# Patient Record
Sex: Male | Born: 1945 | Race: White | Hispanic: No | Marital: Married | Smoking: Former smoker
Health system: Southern US, Community
[De-identification: ages and names within clinical notes are randomized; demographics above are authoritative.]

## PROBLEM LIST (undated history)

## (undated) DIAGNOSIS — E119 Type 2 diabetes mellitus without complications: Secondary | ICD-10-CM

## (undated) DIAGNOSIS — G300 Alzheimer's disease with early onset: Secondary | ICD-10-CM

## (undated) DIAGNOSIS — F0280 Dementia in other diseases classified elsewhere without behavioral disturbance: Secondary | ICD-10-CM

## (undated) DIAGNOSIS — I2510 Atherosclerotic heart disease of native coronary artery without angina pectoris: Secondary | ICD-10-CM

## (undated) DIAGNOSIS — I10 Essential (primary) hypertension: Secondary | ICD-10-CM

## (undated) DIAGNOSIS — E785 Hyperlipidemia, unspecified: Secondary | ICD-10-CM

## (undated) DIAGNOSIS — F32A Depression, unspecified: Secondary | ICD-10-CM

## (undated) DIAGNOSIS — G450 Vertebro-basilar artery syndrome: Secondary | ICD-10-CM

## (undated) DIAGNOSIS — M1990 Unspecified osteoarthritis, unspecified site: Secondary | ICD-10-CM

## (undated) DIAGNOSIS — I272 Pulmonary hypertension, unspecified: Secondary | ICD-10-CM

## (undated) DIAGNOSIS — K219 Gastro-esophageal reflux disease without esophagitis: Secondary | ICD-10-CM

## (undated) DIAGNOSIS — F329 Major depressive disorder, single episode, unspecified: Secondary | ICD-10-CM

## (undated) HISTORY — DX: Major depressive disorder, single episode, unspecified: F32.9

## (undated) HISTORY — DX: Type 2 diabetes mellitus without complications: E11.9

## (undated) HISTORY — DX: Dementia in other diseases classified elsewhere without behavioral disturbance: F02.80

## (undated) HISTORY — DX: Unspecified osteoarthritis, unspecified site: M19.90

## (undated) HISTORY — DX: Gastro-esophageal reflux disease without esophagitis: K21.9

## (undated) HISTORY — DX: Hyperlipidemia, unspecified: E78.5

## (undated) HISTORY — DX: Atherosclerotic heart disease of native coronary artery without angina pectoris: I25.10

## (undated) HISTORY — DX: Vertebro-basilar artery syndrome: G45.0

## (undated) HISTORY — DX: Pulmonary hypertension, unspecified: I27.20

## (undated) HISTORY — DX: Alzheimer's disease with early onset: G30.0

## (undated) HISTORY — DX: Essential (primary) hypertension: I10

## (undated) HISTORY — DX: Depression, unspecified: F32.A

---
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
## 1996-08-30 HISTORY — PX: BACK SURGERY: SHX140

## 2000-08-30 HISTORY — PX: OTHER SURGICAL HISTORY: SHX169

## 2017-04-06 LAB — HEPATIC FUNCTION PANEL: Alkaline Phosphatase: 43 (ref 25–125)

## 2017-04-16 LAB — BASIC METABOLIC PANEL
BUN: 9 (ref 4–21)
CREATININE: 0.8 (ref <=1.3)
Glucose: 148
POTASSIUM: 4 (ref 3.4–5.3)
SODIUM: 138 (ref 137–147)

## 2017-04-16 LAB — CBC AND DIFFERENTIAL
HCT: 48 (ref 41–53)
HEMOGLOBIN: 16.4 (ref 13.5–17.5)
Platelets: 231 (ref 150–399)
WBC: 9

## 2017-04-16 LAB — HEPATIC FUNCTION PANEL
ALT: 24 (ref 10–40)
AST: 16 (ref 14–40)
Bilirubin, Total: 0.4

## 2017-04-16 LAB — LIPID PANEL
CHOLESTEROL: 183 (ref 0–200)
HDL: 49 (ref 35–70)
LDL Cholesterol: 83
TRIGLYCERIDES: 257 — AB (ref 40–160)

## 2017-07-29 ENCOUNTER — Encounter: Payer: Self-pay | Admitting: Family Medicine

## 2017-07-29 ENCOUNTER — Ambulatory Visit: Payer: PRIVATE HEALTH INSURANCE | Admitting: Family Medicine

## 2017-07-29 VITALS — BP 142/74 | HR 73 | Temp 97.4°F | Ht 68.5 in | Wt 295.8 lb

## 2017-07-29 DIAGNOSIS — F0390 Unspecified dementia without behavioral disturbance: Secondary | ICD-10-CM

## 2017-07-29 DIAGNOSIS — Z794 Long term (current) use of insulin: Secondary | ICD-10-CM

## 2017-07-29 DIAGNOSIS — Z7689 Persons encountering health services in other specified circumstances: Secondary | ICD-10-CM

## 2017-07-29 DIAGNOSIS — E1165 Type 2 diabetes mellitus with hyperglycemia: Secondary | ICD-10-CM

## 2017-07-29 MED ORDER — FREESTYLE LIBRE 14 DAY SENSOR MISC: 1.0000 [IU] | 11 refills | Status: DC

## 2017-07-29 NOTE — Progress Notes (Signed)
Subjective:    Patient ID: Cole LasterCharles Harris, male    DOB: 08/31/1945, 71 y.o.   MRN: [id number]  HPI This is a 71 yo male who presents today to establish care. He is accompanied by his wife and daughter in law. He has moved here recently [place]   Has dementia and was followed by neurology every 3 months.   He has history of diabetes. Has Libre and blood sugars are running 150-170. Brings labs with him today from last CPE 8/18, last hemoglobin A1C over 9.   Has history of back pain and multiple back surgeries, has implantable TENs unit, rarely takes pain medication.   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   Last CPE- 8/18 PSA-8/18 Colonoscopy- 2016 Tdap- unknown Flu-annual, already had Dental-regular Eye-regular Exercise-no   Past Medical History:  Diagnosis Date  . Arthritis   . Depression   . Diabetes mellitus without complication (HCC)   . GERD (gastroesophageal reflux disease)   . Hyperlipidemia   . Hypertension     Family History  Problem Relation Age of Onset  . Arthritis Mother   . Arthritis Father   . Hearing loss Father   . Heart disease Father   . Hyperlipidemia Father    Social History   Tobacco Use  . Smoking status: Former Games developermoker  . Smokeless tobacco: Never Used  Substance Use Topics  . Alcohol use: No    Frequency: Never  . Drug use: No      Review of Systems  Constitutional: Negative for fatigue, fever and unexpected weight change.  Respiratory: Negative for cough and shortness of breath.   Cardiovascular: Negative for chest pain, palpitations and leg swelling.  Gastrointestinal: Negative for abdominal pain, constipation and diarrhea.  Genitourinary: Negative for difficulty urinating and frequency.  Musculoskeletal: Positive for back pain (chronic, rarely takes pain meds).  Neurological: Negative for headaches.  Psychiatric/Behavioral: Negative for sleep disturbance.       Objective:   Physical  Exam  Constitutional: He appears well-developed and well-nourished. No distress.  HENT:  Head: Normocephalic and atraumatic.  Eyes: Conjunctivae are normal.  Cardiovascular: Normal rate, regular rhythm and normal heart sounds.  Pulmonary/Chest: Effort normal and breath sounds normal.  Musculoskeletal: He exhibits no edema.  Neurological: He is alert.  Skin: Skin is warm and dry. He is not diaphoretic.  Psychiatric: He has a normal mood and affect. His behavior is normal.  Vitals reviewed.     BP (!) 142/74 (BP Location: Right Arm, Patient Position: Sitting, Cuff Size: Normal)   Pulse 73   Temp (!) 97.4 F (36.3 C) (Oral)   Ht 5' 8.5" (1.74 m)   Wt 295 lb 12 oz (134.2 kg)   SpO2 96%   BMI 44.31 kg/m      Assessment & Plan:  1. Encounter to establish care - will request records - follow up in 3 months - will get him scheduled for Medicare AWV  2. Type 2 diabetes mellitus with hyperglycemia, with long-term current use of insulin (HCC) - will continue to use Libre monitor - last hemoglobin A1c was high, will check today - Continuous Blood Gluc Sensor (FREESTYLE LIBRE 14 DAY SENSOR) MISC; 1 Units by Does not apply route every 14 (fourteen) days.  Dispense: 1 each; Refill: 11 - Hemoglobin A1c  3. Dementia without behavioral disturbance, unspecified dementia type - continue current meds - Ambulatory referral to Neurology   Olean Cole Gessner, FNP-BC  [facility]  [place], [facility]  08/01/2017 5:53 PM

## 2017-07-29 NOTE — Patient Instructions (Signed)
Please stop at the lab  I will notify you of lab results  Please follow up in 3 months

## 2017-07-30 LAB — HEMOGLOBIN A1C
Hgb A1c MFr Bld: 9.4 % of total Hgb — ABNORMAL HIGH (ref <5.7)
Mean Plasma Glucose: 223 (calc)
eAG (mmol/L): 12.4 (calc)

## 2017-08-01 ENCOUNTER — Encounter: Payer: Self-pay | Admitting: Family Medicine

## 2017-08-04 ENCOUNTER — Encounter: Payer: Self-pay | Admitting: Family Medicine

## 2017-08-05 ENCOUNTER — Other Ambulatory Visit: Payer: Self-pay | Admitting: Family Medicine

## 2017-08-05 DIAGNOSIS — Z9989 Dependence on other enabling machines and devices: Principal | ICD-10-CM

## 2017-08-05 DIAGNOSIS — G4733 Obstructive sleep apnea (adult) (pediatric): Secondary | ICD-10-CM

## 2017-08-12 ENCOUNTER — Ambulatory Visit (INDEPENDENT_AMBULATORY_CARE_PROVIDER_SITE_OTHER): Payer: Medicare (Managed Care)

## 2017-08-12 ENCOUNTER — Other Ambulatory Visit: Payer: Self-pay | Admitting: Family Medicine

## 2017-08-12 VITALS — BP 126/70 | HR 69 | Temp 98.1°F | Ht 68.5 in | Wt 276.5 lb

## 2017-08-12 DIAGNOSIS — Z9189 Other specified personal risk factors, not elsewhere classified: Secondary | ICD-10-CM

## 2017-08-12 DIAGNOSIS — Z1159 Encounter for screening for other viral diseases: Secondary | ICD-10-CM

## 2017-08-12 DIAGNOSIS — Z0000 Encounter for general adult medical examination without abnormal findings: Secondary | ICD-10-CM | POA: Diagnosis not present

## 2017-08-12 MED ORDER — FREESTYLE LIBRE SENSOR SYSTEM MISC: 1.0000 [IU] | 5 refills | Status: DC | PRN

## 2017-08-12 NOTE — Patient Instructions (Signed)
[name] , Thank you for taking time to come for your Medicare Wellness Visit. I appreciate your ongoing commitment to your health goals. Please review the following plan we discussed and let me know if I can assist you in the future.   These are the goals we discussed: Goals    . DIET - INCREASE WATER INTAKE     Starting 08/12/2017, I will continue to drink at least 8-10 glasses of water daily.        This is a list of the screening recommended for you and due dates:  Health Maintenance  Topic Date Due  . Complete foot exam   11/27/2017*  . Tetanus Vaccine  08/29/2018*  . Pneumonia vaccines (1 of 2 - PCV13) 08/29/2018*  . Eye exam for diabetics  11/28/2017  . Hemoglobin A1C  01/26/2018  . Colon Cancer Screening  08/30/2024  . Flu Shot  Completed  .  Hepatitis C: One time screening is recommended by Center for Disease Control  (CDC) for  adults born from [birth year] through 1965.   Completed  *Topic was postponed. The date shown is not the original due date.   Preventive Care for Adults  A healthy lifestyle and preventive care can promote health and wellness. Preventive health guidelines for adults include the following key practices.  . A routine yearly physical is a good way to check with your health care provider about your health and preventive screening. It is a chance to share any concerns and updates on your health and to receive a thorough exam.  . Visit your dentist for a routine exam and preventive care every 6 months. Brush your teeth twice a day and floss once a day. Good oral hygiene prevents tooth decay and gum disease.  . The frequency of eye exams is based on your age, health, family medical history, use  of contact lenses, and other factors. Follow your health care provider's recommendations for frequency of eye exams.  . Eat a healthy diet. Foods like vegetables, fruits, whole grains, low-fat dairy products, and lean protein foods contain the nutrients you need without  too many calories. Decrease your intake of foods high in solid fats, added sugars, and salt. Eat the right amount of calories for you. Get information about a proper diet from your health care provider, if necessary.  . Regular physical exercise is one of the most important things you can do for your health. Most adults should get at least 150 minutes of moderate-intensity exercise (any activity that increases your heart rate and causes you to sweat) each week. In addition, most adults need muscle-strengthening exercises on 2 or more days a week.  Silver Sneakers may be a benefit available to you. To determine eligibility, you may visit the website: www.silversneakers.com or contact program at [phone] Mon-Fri between 8AM-8PM.   . Maintain a healthy weight. The body mass index (BMI) is a screening tool to identify possible weight problems. It provides an estimate of body fat based on height and weight. Your health care provider can find your BMI and can help you achieve or maintain a healthy weight.   For adults 20 years and older: ? A BMI below 18.5 is considered underweight. ? A BMI of 18.5 to 24.9 is normal. ? A BMI of 25 to 29.9 is considered overweight. ? A BMI of 30 and above is considered obese.   . Maintain normal blood lipids and cholesterol levels by exercising and minimizing your intake of saturated fat.  Eat a balanced diet with plenty of fruit and vegetables. Blood tests for lipids and cholesterol should begin at age 74 and be repeated every 5 years. If your lipid or cholesterol levels are high, you are over 50, or you are at high risk for heart disease, you may need your cholesterol levels checked more frequently. Ongoing high lipid and cholesterol levels should be treated with medicines if diet and exercise are not working.  . If you smoke, find out from your health care provider how to quit. If you do not use tobacco, please do not start.  . If you choose to drink alcohol,  please do not consume more than 2 drinks per day. One drink is considered to be 12 ounces (355 mL) of beer, 5 ounces (148 mL) of wine, or 1.5 ounces (44 mL) of liquor.  . If you are 72-51 years old, ask your health care provider if you should take aspirin to prevent strokes.  . Use sunscreen. Apply sunscreen liberally and repeatedly throughout the day. You should seek shade when your shadow is shorter than you. Protect yourself by wearing long sleeves, pants, a wide-brimmed hat, and sunglasses year round, whenever you are outdoors.  . Once a month, do a whole body skin exam, using a mirror to look at the skin on your back. Tell your health care provider of new moles, moles that have irregular borders, moles that are larger than a pencil eraser, or moles that have changed in shape or color.

## 2017-08-12 NOTE — Progress Notes (Signed)
PCP notes:   Health maintenance:  Foot exam - PCP please address at next appt Hep C screening - completed Tetanus vaccine - postponed/insurance PNA vaccine - awaiting patients medical records Eye exam - per pt report, exam in April 2018 Colonoscopy - per pt report, screening in 2016 Flu vaccine - per pt report, vaccine in August 2018  Abnormal screenings:   Depression score: 1 Mini-Cog score: 17/20 Hearing - failed  Hearing Screening   125Hz  250Hz  500Hz  1000Hz  2000Hz  3000Hz  4000Hz  6000Hz  8000Hz   Right ear:   40 40 0  0    Left ear:   40 40 40  0     Patient concerns:   Intermittent left knee pain - pt was under a pain management specialist in [place]. Patient unable to use cane for stability. PCP please address at next appt.   Blood glucose - per pt, blood glucose readings have been abnormal. States diet may be cause for elevated readings. Declined to participate in a diabetes education program at this time.   Nurse concerns:  None  Next PCP appt:   10/28/17 @ 1430

## 2017-08-12 NOTE — Progress Notes (Signed)
Pre visit review using our clinic review tool, if applicable. No additional management support is needed unless otherwise documented below in the visit note. 

## 2017-08-12 NOTE — Progress Notes (Signed)
Subjective:   Cole Harris is a 71 y.o. male who presents for an Initial Medicare Annual Wellness Visit.  Review of Systems  N/A Cardiac Risk Factors include: advanced age (>3755men, 52>65 women);diabetes mellitus;male gender;obesity (BMI >30kg/m2)    Objective:    Today's Vitals   08/12/17 1513  BP: 126/70  Pulse: 69  Temp: 98.1 F (36.7 C)  TempSrc: Oral  SpO2: 94%  Weight: 276 lb 8 oz (125.4 kg)  Height: 5' 8.5" (1.74 m)  PainSc: 0-No pain   Body mass index is 41.43 kg/m.  Advanced Directives 08/12/2017  Does Patient Have a Medical Advance Directive? No  Would patient like information on creating a medical advance directive? Yes (MAU/Ambulatory/Procedural Areas - Information given)    Current Medications (verified) Outpatient Encounter Medications as of 08/12/2017  Medication Sig  . atorvastatin (LIPITOR) 20 MG tablet Take 20 mg by mouth daily.  Marland Kitchen. buPROPion (WELLBUTRIN XL) 150 MG 24 hr tablet Take 150 mg by mouth daily.  . Cholecalciferol (VITAMIN D3) 50000 units TABS Take 50,000 Units by mouth once a week.  . Continuous Blood Gluc Sensor (FREESTYLE LIBRE SENSOR SYSTEM) MISC 1 Units by Does not apply route as needed.  Marland Kitchen. escitalopram (LEXAPRO) 20 MG tablet Take 20 mg by mouth daily.  . fenofibrate (TRICOR) 145 MG tablet Take 145 mg by mouth daily.  . furosemide (LASIX) 40 MG tablet Take 40 mg by mouth.  . insulin aspart (NOVOLOG) 100 UNIT/ML injection Inject 8 Units into the skin 3 (three) times daily before meals.  . insulin glargine (LANTUS) 100 UNIT/ML injection Inject 20 Units into the skin at bedtime.   . irbesartan (AVAPRO) 300 MG tablet Take 300 mg by mouth daily.  . Memantine HCl-Donepezil HCl (NAMZARIC) 28-10 MG CP24 Take 1 capsule by mouth daily.  . niacin (NIASPAN) 500 MG CR tablet Take 500 mg by mouth at bedtime.  Marland Kitchen. omeprazole (PRILOSEC) 20 MG capsule Take 20 mg by mouth daily.  . potassium chloride SA (K-DUR,KLOR-CON) 20 MEQ tablet Take 20 mEq by mouth  once.  . sitaGLIPtin-metformin (JANUMET) 50-1000 MG tablet Take 1 tablet by mouth 2 (two) times daily with a meal.  . [DISCONTINUED] Continuous Blood Gluc Sensor (FREESTYLE LIBRE 14 DAY SENSOR) MISC 1 Units by Does not apply route every 14 (fourteen) days.   No facility-administered encounter medications on file as of 08/12/2017.     Allergies (verified) Patient has no known allergies.   History: Past Medical History:  Diagnosis Date  . Arthritis   . Depression   . Diabetes mellitus without complication (HCC)   . GERD (gastroesophageal reflux disease)   . Hyperlipidemia   . Hypertension    Past Surgical History:  Procedure Laterality Date  . BACK SURGERY  1998  . INTERNAL TENS UNIT  2002  . TONSILLECTOMY  1955   Family History  Problem Relation Age of Onset  . Arthritis Mother   . Arthritis Father   . Hearing loss Father   . Heart disease Father   . Hyperlipidemia Father    Social History   Socioeconomic History  . Marital status: Married    Spouse name: None  . Number of children: None  . Years of education: None  . Highest education level: None  Social Needs  . Financial resource strain: None  . Food insecurity - worry: None  . Food insecurity - inability: None  . Transportation needs - medical: None  . Transportation needs - non-medical: None  Occupational History  .  None  Tobacco Use  . Smoking status: Former Games developermoker  . Smokeless tobacco: Never Used  Substance and Sexual Activity  . Alcohol use: No    Frequency: Never  . Drug use: No  . Sexual activity: No    Birth control/protection: Cervical cap  Other Topics Concern  . None  Social History Narrative  . None   Tobacco Counseling Counseling given: No   Clinical Intake:  Pre-visit preparation completed: Yes  Pain : No/denies pain Pain Score: 0-No pain     Nutritional Status: BMI > 30  Obese Nutritional Risks: None Diabetes: Yes CBG done?: No Did pt. bring in CBG monitor from home?:  No  How often do you need to have someone help you when you read instructions, pamphlets, or other written materials from your doctor or pharmacy?: 1 - Never What is the last grade level you completed in school?: 12th grade + vocational courses  Interpreter Needed?: No  Comments: pt and spouse live with son and daughter-in-law Information entered by :: LPinson, LPN  Activities of Daily Living In your present state of health, do you have any difficulty performing the following activities: 08/12/2017  Hearing? Y  Comment pt reports ringing in both ears  Vision? N  Difficulty concentrating or making decisions? Y  Walking or climbing stairs? Y  Comment SOB when climbing stairs  Dressing or bathing? N  Doing errands, shopping? N  Preparing Food and eating ? N  Using the Toilet? N  In the past six months, have you accidently leaked urine? N  Do you have problems with loss of bowel control? N  Managing your Medications? Y  Managing your Finances? Y  Housekeeping or managing your Housekeeping? N     Immunizations and Health Maintenance  There is no immunization history on file for this patient. There are no preventive care reminders to display for this patient.  Patient Care Team: Emi BelfastGessner, Deborah B, FNP as PCP - General (Nurse Practitioner)  Assessment:   This is a routine wellness examination for Cole Harris.  Hearing/Vision screen  Hearing Screening   125Hz  250Hz  500Hz  1000Hz  2000Hz  3000Hz  4000Hz  6000Hz  8000Hz   Right ear:   40 40 0  0    Left ear:   40 40 40  0    Vision Screening Comments: Last vision exam in 03/2017  Dietary issues and exercise activities discussed: Current Exercise Habits: The patient does not participate in regular exercise at present, Exercise limited by: orthopedic condition(s)  Goals    . DIET - INCREASE WATER INTAKE     Starting 08/12/2017, I will continue to drink at least 8-10 glasses of water daily.       Depression Screen PHQ 2/9 Scores  08/12/2017  PHQ - 2 Score 0  PHQ- 9 Score 1    Fall Risk Fall Risk  08/12/2017  Falls in the past year? No   Cognitive Function: MMSE - Mini Mental State Exam 08/12/2017  Orientation to time 5  Orientation to Place 5  Registration 3  Attention/ Calculation 0  Recall 0  Recall-comments unable to recall 3 of 3 words  Language- name 2 objects 0  Language- repeat 1  Language- follow 3 step command 3  Language- read & follow direction 0  Write a sentence 0  Copy design 0  Total score 17     PLEASE NOTE: A Mini-Cog screen was completed. Maximum score is 20. A value of 0 denotes this part of Folstein MMSE was not completed  or the patient failed this part of the Mini-Cog screening.   Mini-Cog Screening Orientation to Time - Max 5 pts Orientation to Place - Max 5 pts Registration - Max 3 pts Recall - Max 3 pts Language Repeat - Max 1 pts Language Follow 3 Step Command - Max 3 pts    Screening Tests Health Maintenance  Topic Date Due  . FOOT EXAM  11/27/2017 (Originally 06/02/1956)  . TETANUS/TDAP  08/29/2018 (Originally 06/02/1966)  . PNA vac Low Risk Adult (1 of 2 - PCV13) 08/29/2018 (Originally 06/02/2012)  . OPHTHALMOLOGY EXAM  11/28/2017  . HEMOGLOBIN A1C  01/26/2018  . COLONOSCOPY  08/30/2024  . INFLUENZA VACCINE  Completed  . Hepatitis C Screening  Completed       Plan:   I have personally reviewed, addressed, and noted the following in the patient's chart:  A. Medical and social history B. Use of alcohol, tobacco or illicit drugs  C. Current medications and supplements D. Functional ability and status E.  Nutritional status F.  Physical activity G. Advance directives H. List of other physicians I.  Hospitalizations, surgeries, and ER visits in previous 12 months J.  Vitals K. Screenings to include hearing, vision, cognitive, depression L. Referrals and appointments - none  In addition, I have reviewed and discussed with patient certain preventive protocols,  quality metrics, and best practice recommendations. A written personalized care plan for preventive services as well as general preventive health recommendations were provided to patient.  See attached scanned questionnaire for additional information.   Signed,   Randa Evens, MHA, BS, LPN Health Coach

## 2017-08-13 LAB — HEPATITIS C ANTIBODY
Hepatitis C Ab: NONREACTIVE
SIGNAL TO CUT-OFF: 0.02 (ref <1.00)

## 2017-08-15 ENCOUNTER — Encounter: Payer: Self-pay | Admitting: Internal Medicine

## 2017-08-15 ENCOUNTER — Other Ambulatory Visit: Payer: Self-pay | Admitting: Family Medicine

## 2017-08-15 DIAGNOSIS — R2681 Unsteadiness on feet: Secondary | ICD-10-CM

## 2017-08-15 MED ORDER — "ADJUSTABLE ALUMINUM CANE 3/4"" MISC": 1.0000 [IU] | 0 refills | Status: DC | PRN

## 2017-08-16 ENCOUNTER — Ambulatory Visit: Payer: Self-pay | Admitting: *Deleted

## 2017-08-16 ENCOUNTER — Encounter: Payer: Self-pay | Admitting: Internal Medicine

## 2017-08-16 ENCOUNTER — Ambulatory Visit (INDEPENDENT_AMBULATORY_CARE_PROVIDER_SITE_OTHER): Payer: PRIVATE HEALTH INSURANCE | Admitting: Internal Medicine

## 2017-08-16 VITALS — BP 140/78 | HR 83 | Resp 16 | Ht 68.5 in | Wt 293.0 lb

## 2017-08-16 DIAGNOSIS — R0609 Other forms of dyspnea: Secondary | ICD-10-CM

## 2017-08-16 DIAGNOSIS — G4733 Obstructive sleep apnea (adult) (pediatric): Secondary | ICD-10-CM

## 2017-08-16 DIAGNOSIS — E538 Deficiency of other specified B group vitamins: Secondary | ICD-10-CM | POA: Insufficient documentation

## 2017-08-16 NOTE — Patient Instructions (Addendum)
Continue using your Bipap every night.   Will check pulmonary function test, chest xray, overnight oxygen test on 2 liters and bipap.

## 2017-08-16 NOTE — Progress Notes (Signed)
[facility] [place] [facility] Consultation      Assessment and Plan:  Obstructive sleep apnea with sleep-related hypoxemia. -Patient's obstructive sleep apnea appears to be adequately treated at current BiPAP level of 18/9. - Continue BiPAP.  Dyspnea on exertion. -Multifactorial due to obesity, deconditioning/debility, as well as possible COPD. - We will check pulmonary function test to look for evidence of COPD, as well as baseline chest x-ray.  Orders Placed This Encounter  Procedures  . DG Chest 2 View  . Pulmonary Function Test [facility] Only  . Pulse oximetry, overnight    Return in about 6 months (around 02/14/2018).   Date: 08/16/2017  MRN# [id number] Jerimyah Vandunk [birth year]    Dearion Huot is a 71 y.o. old male seen in consultation for chief complaint of:    Chief Complaint  Patient presents with  . Advice Only    referred by Dr. Leone Payor for continuation of care OSA    HPI:   The patient is a 71 yo male, he is present with his wife and daughter who gives some of the history.  He has had a sleep study in the past and he has a CPAP, and is on it every night. He was started on it for more than 20 years, current machine is about 1 or 70 yrs old. They tried to do a sleep study but he apparently could not sleep. He feels that he is doing well with his CPAP. He thinks that the CPAP is helping but he still gets tired easily.  He goes to bed at MN or later, falls easily with cpap. Wife wakes him and he gets out of bed at 10:30 am. He feels that he is rested at those time but he can still sleep more.  He does not snore with the machine.  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   He feels that his breathing is doing well but he gets winded easily. He can do most ADL's. He smoked last 25 years ago, he smoked 1 to 2 ppd at that time, he stopped after his wife had a heart attack.    He has been told that he has COPD.   Review of download data 90 days as of 08/15/17; 88/90 days, avg 10 hrs per night. bipap settings of 18/9; residual AHI is 6.3.     PMHX:   Past Medical History:  Diagnosis Date  . Arthritis   . Depression   . Diabetes mellitus without complication (HCC)   . GERD (gastroesophageal reflux disease)   . Hyperlipidemia   . Hypertension    Surgical Hx:  Past Surgical History:  Procedure Laterality Date  . BACK SURGERY  1998  . INTERNAL TENS UNIT  2002  . TONSILLECTOMY  1955   Family Hx:  Family History  Problem Relation Age of Onset  . Arthritis Mother   . Arthritis Father   . Hearing loss Father   . Heart disease Father   . Hyperlipidemia Father    Social Hx:   Social History   Tobacco Use  . Smoking status: Former Games developer  . Smokeless tobacco: Never Used  Substance Use Topics  . Alcohol use: No    Frequency: Never  . Drug use: No   Medication:    Current Outpatient Medications:  .  atorvastatin (LIPITOR) 20 MG tablet, Take 20 mg by mouth daily., Disp: ,  Rfl:  .  buPROPion (WELLBUTRIN XL) 150 MG 24 hr tablet, Take 150 mg by mouth daily., Disp: , Rfl:  .  Cholecalciferol (VITAMIN D3) 50000 units TABS, Take 50,000 Units by mouth once a week., Disp: , Rfl:  .  Continuous Blood Gluc Sensor (FREESTYLE LIBRE SENSOR SYSTEM) MISC, 1 Units by Does not apply route as needed., Disp: 3 each, Rfl: 5 .  escitalopram (LEXAPRO) 20 MG tablet, Take 20 mg by mouth daily., Disp: , Rfl:  .  fenofibrate (TRICOR) 145 MG tablet, Take 145 mg by mouth daily., Disp: , Rfl:  .  furosemide (LASIX) 40 MG tablet, Take 40 mg by mouth., Disp: , Rfl:  .  insulin aspart (NOVOLOG) 100 UNIT/ML injection, Inject 8 Units into the skin 3 (three) times daily before meals., Disp: , Rfl:  .  insulin glargine (LANTUS) 100 UNIT/ML injection, Inject 20 Units into the skin at bedtime. , Disp: , Rfl:  .  irbesartan (AVAPRO) 300 MG tablet, Take 300 mg by mouth daily., Disp: ,  Rfl:  .  Memantine HCl-Donepezil HCl (NAMZARIC) 28-10 MG CP24, Take 1 capsule by mouth daily., Disp: , Rfl:  .  Misc. Devices (ADJUSTABLE ALUMINUM CANE 3/4") MISC, 1 Units by Does not apply route as needed., Disp: 1 each, Rfl: 0 .  niacin (NIASPAN) 500 MG CR tablet, Take 500 mg by mouth at bedtime., Disp: , Rfl:  .  omeprazole (PRILOSEC) 20 MG capsule, Take 20 mg by mouth daily., Disp: , Rfl:  .  potassium chloride SA (K-DUR,KLOR-CON) 20 MEQ tablet, Take 20 mEq by mouth once., Disp: , Rfl:  .  sitaGLIPtin-metformin (JANUMET) 50-1000 MG tablet, Take 1 tablet by mouth 2 (two) times daily with a meal., Disp: , Rfl:    Allergies:  Patient has no known allergies.  Review of Systems: Gen:  Denies  fever, sweats, chills HEENT: Denies blurred vision, double vision. bleeds, sore throat Cvc:  No dizziness, chest pain. Resp:   Denies cough or sputum production, shortness of breath Gi: Denies swallowing difficulty, stomach pain. Gu:  Denies bladder incontinence, burning urine Ext:   No Joint pain, stiffness. Skin: No skin rash,  hives  Endoc:  No polyuria, polydipsia. Psych: No depression, insomnia. Other:  All other systems were reviewed with the patient and were negative other that what is mentioned in the HPI.   Physical Examination:   VS: BP 140/78 (BP Location: Left Arm, Cuff Size: Normal)   Pulse 83   Resp 16   Ht 5' 8.5" (1.74 m)   Wt 293 lb (132.9 kg)   SpO2 95%   BMI 43.90 kg/m   General Appearance: No distress  Neuro:without focal findings,  speech normal,  HEENT: PERRLA, EOM intact.   Pulmonary: normal breath sounds, No wheezing.  CardiovascularNormal S1,S2.  No m/r/g.   Abdomen: Benign, Soft, non-tender. Renal:  No costovertebral tenderness  GU:  No performed at this time. Endoc: No evident thyromegaly, no signs of acromegaly. Skin:   warm, no rashes, no ecchymosis  Extremities: normal, no cyanosis, clubbing.  Other findings:    LABORATORY PANEL:   CBC No results  for input(s): WBC, HGB, HCT, PLT in the last 168 hours. ------------------------------------------------------------------------------------------------------------------  Chemistries  No results for input(s): NA, K, CL, CO2, GLUCOSE, BUN, CREATININE, CALCIUM, MG, AST, ALT, ALKPHOS, BILITOT in the last 168 hours.  Invalid input(s): GFRCGP ------------------------------------------------------------------------------------------------------------------  Cardiac Enzymes No results for input(s): TROPONINI in the last 168 hours. ------------------------------------------------------------  RADIOLOGY:  No results found.  Thank  you for the consultation and for allowing [facility] to assist in the care of your patient. Our recommendations are noted above.  Please contact us if we can be of further service.   Wells Guileseep Bethann Qualley, MD.  Board Certified in Internal Medicine, Pulmonary Medicine, Critical Care Medicine, and Sleep Medicine.  [facility] Office Number: [phone]  Santiago Gladavid Kasa, M.D.  Billy Fischeravid Simonds, M.D  08/16/2017

## 2017-08-16 NOTE — Telephone Encounter (Signed)
Pt's daughter in law Clydie BraunKaren calling that the pt has been experiencing dizziness through out the day that has worsened after getting home from appts with neurologist and pulmonologist. Pt states that the pt stumbled into the house and had is currently lying down in the bed. Pt is having complaints of seeing things "floating" while lying down with eyes closed. Pt also had one episode of emesis with current dizziness. Pt's current blood sugar was 148 and pt denies any pain at this time. Pt's daughter in law advised to take the pt to ED for further evaluation.    Reason for Disposition . SEVERE dizziness (e.g., unable to stand, requires support to walk, feels like passing out now)  Answer Assessment - Initial Assessment Questions 1. DESCRIPTION: "Describe your dizziness."     Seeing stuff floating around with eyes closed 2. LIGHTHEADED: "Do you feel lightheaded?" (e.g., somewhat faint, woozy, weak upon standing)     Feeling light headed with lying down 3. VERTIGO: "Do you feel like either you or the room is spinning or tilting?" (i.e. vertigo)     No 4. SEVERITY: "How bad is it?"  "Do you feel like you are going to faint?" "Can you stand and walk?"   - MILD - walking normally   - MODERATE - interferes with normal activities (e.g., work, school)    - SEVERE - unable to stand, requires support to walk, feels like passing out now.      severe 5. ONSET:  "When did the dizziness begin?"     today 6. AGGRAVATING FACTORS: "Does anything make it worse?" (e.g., standing, change in head position)     Position, pt states he can not 7. HEART RATE: "Can you tell me your heart rate?" "How many beats in 15 seconds?"  (Note: not all patients can do this)       19bpm per 15sec 8. CAUSE: "What do you think is causing the dizziness?"     unsure 9. RECURRENT SYMPTOM: "Have you had dizziness before?" If so, ask: "When was the last time?" "What happened that time?"     No 10. OTHER SYMPTOMS: "Do you have any other  symptoms?" (e.g., fever, chest pain, vomiting, diarrhea, bleeding) Vomiting  Protocols used: DIZZINESS Tereasa Coop- LIGHTHEADEDNESS-A-AH

## 2017-08-17 ENCOUNTER — Telehealth: Payer: Self-pay | Admitting: Internal Medicine

## 2017-08-17 ENCOUNTER — Telehealth: Payer: Self-pay | Admitting: Emergency Medicine

## 2017-08-17 NOTE — Telephone Encounter (Signed)
I spoke with Mrs Cole Harris and pt felt better last night and did not go to ED. Pt is still asleep this morning and Mrs  Cole Harris will cb if needed after pt wakes up. Pt has CPX scheduled 08/18/17. FYI to Harlin Heys Gessner FNP.

## 2017-08-17 NOTE — Telephone Encounter (Signed)
Barbara CowerJason from [facility] called to tell Dr. Ardyth Manam that they cannot preform the ONO for patient because they are not in current service with [facility] Patients O2 provider should be able to handle this  Jason's phone is [phone] ext. [phone]

## 2017-08-17 NOTE — Telephone Encounter (Signed)
Called and left detailed message informing patient that prescription has for cane is here at the office. Wanted to know if he wanted to come pick it up or have us mail for fax it. Also Debbie wanted to know how his blood sugars have been since the change in medication. Please ask patient upon his return call to office.

## 2017-08-17 NOTE — Telephone Encounter (Signed)
[facility] and he states that he can't get the ONO done due to pt has O2 at this time from [place] company [place].  Called pt and wife and informed them we can't do an ONO due to [place] and that they need to contact that company to see if they are going to continue to pay for the O2.   DR informed until pt takes care [place] we can't do an ONO.

## 2017-08-17 NOTE — Telephone Encounter (Signed)
Please see message below

## 2017-08-18 ENCOUNTER — Emergency Department
Admission: EM | Admit: 2017-08-18 | Discharge: 2017-08-18 | Disposition: A | Discharge status: Home / Self Care | Payer: Medicare Other | Attending: Emergency Medicine | Admitting: Emergency Medicine

## 2017-08-18 ENCOUNTER — Other Ambulatory Visit: Payer: Self-pay

## 2017-08-18 ENCOUNTER — Telehealth: Payer: Self-pay | Admitting: Family Medicine

## 2017-08-18 ENCOUNTER — Ambulatory Visit: Payer: Self-pay | Admitting: *Deleted

## 2017-08-18 ENCOUNTER — Emergency Department: Payer: Medicare Other

## 2017-08-18 DIAGNOSIS — E119 Type 2 diabetes mellitus without complications: Secondary | ICD-10-CM | POA: Diagnosis not present

## 2017-08-18 DIAGNOSIS — Z87891 Personal history of nicotine dependence: Secondary | ICD-10-CM | POA: Insufficient documentation

## 2017-08-18 DIAGNOSIS — R42 Dizziness and giddiness: Secondary | ICD-10-CM | POA: Diagnosis not present

## 2017-08-18 DIAGNOSIS — I10 Essential (primary) hypertension: Secondary | ICD-10-CM | POA: Insufficient documentation

## 2017-08-18 DIAGNOSIS — R110 Nausea: Secondary | ICD-10-CM | POA: Insufficient documentation

## 2017-08-18 LAB — URINALYSIS, COMPLETE (UACMP) WITH MICROSCOPIC
BILIRUBIN URINE: NEGATIVE
Bacteria, UA: NONE SEEN
GLUCOSE, UA: NEGATIVE mg/dL
Hgb urine dipstick: NEGATIVE
KETONES UR: NEGATIVE mg/dL
LEUKOCYTES UA: NEGATIVE
Nitrite: NEGATIVE
PH: 5 (ref 5.0–8.0)
Protein, ur: NEGATIVE mg/dL
SPECIFIC GRAVITY, URINE: 1.018 (ref 1.005–1.030)

## 2017-08-18 LAB — COMPREHENSIVE METABOLIC PANEL
ALK PHOS: 37 U/L — AB (ref 38–126)
ALT: 20 U/L (ref 17–63)
AST: 28 U/L (ref 15–41)
Albumin: 4.4 g/dL (ref 3.5–5.0)
Anion gap: 9 (ref 5–15)
BILIRUBIN TOTAL: 0.8 mg/dL (ref 0.3–1.2)
BUN: 10 mg/dL (ref 6–20)
CALCIUM: 10.1 mg/dL (ref 8.9–10.3)
CO2: 23 mmol/L (ref 22–32)
CREATININE: 0.72 mg/dL (ref 0.61–1.24)
Chloride: 104 mmol/L (ref 101–111)
Glucose, Bld: 161 mg/dL — ABNORMAL HIGH (ref 65–99)
Potassium: 4 mmol/L (ref 3.5–5.1)
Sodium: 136 mmol/L (ref 135–145)
TOTAL PROTEIN: 7.7 g/dL (ref 6.5–8.1)

## 2017-08-18 LAB — CBC
HCT: 48.3 % (ref 40.0–52.0)
Hemoglobin: 16.5 g/dL (ref 13.0–18.0)
MCH: 32.6 pg (ref 26.0–34.0)
MCHC: 34.2 g/dL (ref 32.0–36.0)
MCV: 95.2 fL (ref 80.0–100.0)
PLATELETS: 215 10*3/uL (ref 150–440)
RBC: 5.07 MIL/uL (ref 4.40–5.90)
RDW: 12.8 % (ref 11.5–14.5)
WBC: 8 10*3/uL (ref 3.8–10.6)

## 2017-08-18 LAB — LIPASE, BLOOD: LIPASE: 26 U/L (ref 11–51)

## 2017-08-18 LAB — TROPONIN I: Troponin I: <0.03 ng/mL (ref <0.03)

## 2017-08-18 MED ORDER — MECLIZINE HCL 25 MG PO TABS
25.0000 mg | ORAL_TABLET | Freq: Once | ORAL | Status: AC
  Administered 2017-08-18: 25 mg via ORAL
  Filled 2017-08-18: qty 1

## 2017-08-18 MED ORDER — MECLIZINE HCL 25 MG PO TABS: 25.0000 mg | ORAL_TABLET | Freq: Three times a day (TID) | ORAL | 1 refills | Status: DC | PRN

## 2017-08-18 NOTE — ED Provider Notes (Signed)
[facility] Emergency Department Provider Note       Time seen: ----------------------------------------- 4:44 PM on 08/18/2017 -----------------------------------------   I have reviewed the triage vital signs and the nursing notes.  HISTORY   Chief Complaint Dizziness and Nausea    HPI Cole Harris is a 71 y.o. male with a history of depression, diabetes, hypertension and hyperlipidemia who presents to the ED for dizziness with nausea and vomiting for several days.  Patient states his last emesis was last night.  He does have a slight headache.  Family obtained a blood sugar of 198 because they thought his blood sugar was making him dizzy.  Dizziness seems to be worse with standing.  Family has also described fatigue.  Past Medical History:  Diagnosis Date  . Arthritis   . Depression   . Diabetes mellitus without complication (HCC)   . GERD (gastroesophageal reflux disease)   . Hyperlipidemia   . Hypertension     There are no active problems to display for this patient.   Past Surgical History:  Procedure Laterality Date  . BACK SURGERY  1998  . INTERNAL TENS UNIT  2002  . TONSILLECTOMY  1955    Allergies Patient has no known allergies.  Social History Social History   Tobacco Use  . Smoking status: Former Games developermoker  . Smokeless tobacco: Never Used  Substance Use Topics  . Alcohol use: No    Frequency: Never  . Drug use: No    Review of Systems Constitutional: Negative for fever. Eyes: Negative for vision changes ENT:  Negative for congestion, sore throat Cardiovascular: Negative for chest pain. Respiratory: Negative for shortness of breath. Gastrointestinal: Negative for abdominal pain, vomiting and diarrhea. Genitourinary: Negative for dysuria. Musculoskeletal: Negative for back pain. Skin: Negative for rash. Neurological: Negative for headaches, focal weakness or numbness.  Positive for dizziness  All systems  negative/normal/unremarkable except as stated in the HPI  ____________________________________________   PHYSICAL EXAM:  VITAL SIGNS: ED Triage Vitals  Enc Vitals Group     BP 08/18/17 1515 140/71     Pulse Rate 08/18/17 1515 79     Resp 08/18/17 1515 16     Temp 08/18/17 1515 (!) 97.4 F (36.3 C)     Temp Source 08/18/17 1515 Oral     SpO2 08/18/17 1515 93 %     Weight 08/18/17 1516 290 lb (131.5 kg)     Height 08/18/17 1516 5\' 9"  (1.753 m)     Head Circumference --      Peak Flow --      Pain Score 08/18/17 1515 5     Pain Loc --      Pain Edu? --      Excl. in GC? --     Constitutional: Alert and oriented. Well appearing and in no distress. Eyes: Conjunctivae are normal. Normal extraocular movements. ENT   Head: Normocephalic and atraumatic.   Nose: No congestion/rhinnorhea.   Mouth/Throat: Mucous membranes are moist.   Neck: No stridor. Cardiovascular: Normal rate, regular rhythm. No murmurs, rubs, or gallops. Respiratory: Normal respiratory effort without tachypnea nor retractions. Breath sounds are clear and equal bilaterally. No wheezes/rales/rhonchi. Gastrointestinal: Soft and nontender. Normal bowel sounds Musculoskeletal: Nontender with normal range of motion in extremities. No lower extremity tenderness nor edema. Neurologic:  Normal speech and language. No gross focal neurologic deficits are appreciated.  Negative Romberg sign Skin:  Skin is warm, dry and intact. No rash noted. Psychiatric: Mood and affect are normal.  Speech and behavior are normal.  ____________________________________________  EKG: Interpreted by me.  Sinus rhythm rate of 77 bpm, normal PR interval, normal QRS, normal QT.  Left axis deviation  ____________________________________________  ED COURSE:  As part of my medical decision making, I reviewed the following data within the electronic MEDICAL RECORD NUMBER History obtained from family if available, nursing notes, old chart  and ekg, as well as notes from prior ED visits. Patient presented for dizziness and fatigue, we will assess with labs and imaging as indicated at this time.   Procedures ____________________________________________   LABS (pertinent positives/negatives)  Labs Reviewed  COMPREHENSIVE METABOLIC PANEL - Abnormal; Notable for the following components:      Result Value   Glucose, Bld 161 (*)    Alkaline Phosphatase 37 (*)    All other components within normal limits  URINALYSIS, COMPLETE (UACMP) WITH MICROSCOPIC - Abnormal; Notable for the following components:   Color, Urine YELLOW (*)    APPearance CLEAR (*)    Squamous Epithelial / LPF 0-5 (*)    All other components within normal limits  LIPASE, BLOOD  CBC    RADIOLOGY Images were viewed by me  CT head Is unremarkable ____________________________________________  DIFFERENTIAL DIAGNOSIS   Peripheral vertigo, dehydration, electrolyte abnormality, medication side effect, CVA  FINAL ASSESSMENT AND PLAN  Dizziness, weakness   Plan: Patient had presented for dizziness and weakness. Patient's labs were within normal limits. Patient's imaging did not reveal any acute process.  He does describe some symptoms of peripheral vertigo.  I think he is had a viral illness with recent diarrhea which is causing vertigo.  He has received meclizine and states he currently feels better.  He is stable for outpatient follow-up.   Emily FilbertWilliams, Ameya Vowell E, MD   Note: This note was generated in part or whole with voice recognition software. Voice recognition is usually quite accurate but there are transcription errors that can and very often do occur. I apologize for any typographical errors that were not detected and corrected.     Emily FilbertWilliams, Johnnette Laux E, MD 08/18/17 Mikle [id number]

## 2017-08-18 NOTE — Telephone Encounter (Signed)
See previous triage note. Daughter in law called back reporting worsening symptoms. Advised to go to ED.

## 2017-08-18 NOTE — ED Notes (Signed)
Pt denies n/v and dizziness at this time.

## 2017-08-18 NOTE — Telephone Encounter (Signed)
Carlean PurlMaryann Barbour RN also noted; Daughter in law reports pt "getting worse." Taking pt to ED.

## 2017-08-18 NOTE — Telephone Encounter (Signed)
Pts daughter- in- law called to report pt with dizziness, nausea and vomiting and increased downiness. Spoke directly to pt. who denies any of those symptoms today, "I think I had a virus. I feel fine today."  Relayed this conversation to daughter- in- law who reports pt recently dx with moderate dementia and states information provided by pt is inaccurate. She states pt refuses to go to ED. Spoke with Environmental consultantlow Coordinator Waynetta. Appt made for 08/19/17 with Dr. Milinda Antisower. Advised to go to ED if symptoms worsen. Verbalizes understanding.

## 2017-08-18 NOTE — Telephone Encounter (Signed)
Called and spoke with patients wife, she states that they will be by the office today to pick prescription up. I informed her that I placed it upfront and it will be ready when she gets here. She also states that blood sugars have been better averaging between 115-120. I informed her that I would let Debbie know. Nothing further needed at this time.

## 2017-08-18 NOTE — ED Triage Notes (Signed)
Pt arrives to ED with c/o of dizziness and N&V x few days. Pt states last vomit was last night. States slight HA. Family took CBG and was 190 when they thought his sugar was making him dizzy. Pt appears tired. Alert and oriented. HOH.

## 2017-08-18 NOTE — ED Notes (Signed)
Pt was offered ODT zofran but states he doesn't want that at this time.

## 2017-08-18 NOTE — Telephone Encounter (Signed)
Opened in error

## 2017-08-19 ENCOUNTER — Ambulatory Visit: Payer: PRIVATE HEALTH INSURANCE | Admitting: Family Medicine

## 2017-08-24 ENCOUNTER — Ambulatory Visit: Payer: Self-pay

## 2017-08-24 ENCOUNTER — Encounter: Payer: Self-pay | Admitting: Family Medicine

## 2017-08-24 DIAGNOSIS — I272 Pulmonary hypertension, unspecified: Secondary | ICD-10-CM | POA: Insufficient documentation

## 2017-08-24 DIAGNOSIS — E039 Hypothyroidism, unspecified: Secondary | ICD-10-CM | POA: Insufficient documentation

## 2017-08-24 DIAGNOSIS — E1169 Type 2 diabetes mellitus with other specified complication: Secondary | ICD-10-CM | POA: Insufficient documentation

## 2017-08-24 DIAGNOSIS — I2510 Atherosclerotic heart disease of native coronary artery without angina pectoris: Secondary | ICD-10-CM

## 2017-08-24 DIAGNOSIS — E785 Hyperlipidemia, unspecified: Secondary | ICD-10-CM | POA: Insufficient documentation

## 2017-08-24 DIAGNOSIS — G300 Alzheimer's disease with early onset: Secondary | ICD-10-CM

## 2017-08-24 DIAGNOSIS — F0280 Dementia in other diseases classified elsewhere without behavioral disturbance: Secondary | ICD-10-CM

## 2017-08-24 DIAGNOSIS — G450 Vertebro-basilar artery syndrome: Secondary | ICD-10-CM | POA: Insufficient documentation

## 2017-08-24 HISTORY — DX: Vertebro-basilar artery syndrome: G45.0

## 2017-08-24 HISTORY — DX: Pulmonary hypertension, unspecified: I27.20

## 2017-08-24 HISTORY — DX: Atherosclerotic heart disease of native coronary artery without angina pectoris: I25.10

## 2017-08-24 HISTORY — DX: Dementia in other diseases classified elsewhere, unspecified severity, without behavioral disturbance, psychotic disturbance, mood disturbance, and anxiety: F02.80

## 2017-08-24 NOTE — Telephone Encounter (Signed)
   Reason for Disposition . [1] MODERATE dizziness (e.g., vertigo; feels very unsteady, interferes with normal activities) AND [2] has been evaluated by physician for this  Answer Assessment - Initial Assessment Questions 1. DESCRIPTION: "Describe your dizziness."     Still dizzy - taking Meclizine 2. VERTIGO: "Do you feel like either you or the room is spinning or tilting?"      Yes 3. LIGHTHEADED: "Do you feel lightheaded?" (e.g., somewhat faint, woozy, weak upon standing)     Feels woozy 4. SEVERITY: "How bad is it?"  "Can you walk?"   - MILD - Feels unsteady but walking normally.   - MODERATE - Feels very unsteady when walking, but not falling; interferes with normal activities (e.g., school, work) .   - SEVERE - Unable to walk without falling (requires assistance).     Moderate 5. ONSET:  "When did the dizziness begin?"     Started last Tuesday 6. AGGRAVATING FACTORS: "Does anything make it worse?" (e.g., standing, change in head position)     Changing position. 7. CAUSE: "What do you think is causing the dizziness?"     Vertigo 8. RECURRENT SYMPTOM: "Have you had dizziness before?" If so, ask: "When was the last time?" "What happened that time?"     Yes- taking Meclizine 9. OTHER SYMPTOMS: "Do you have any other symptoms?" (e.g., headache, weakness, numbness, vomiting, earache)     No 10. PREGNANCY: "Is there any chance you are pregnant?" "When was your last menstrual period?"       No  Protocols used: DIZZINESS - VERTIGO-A-AH  Pt. Seen in ED 08/18/17 - Diagnosed with vertigo and dehydration. Family states he is still having episodes of dizziness. Meclizine does help, but makes him sleepy. Appointment made for this week.

## 2017-08-26 ENCOUNTER — Encounter: Payer: Self-pay | Admitting: Primary Care

## 2017-08-26 ENCOUNTER — Ambulatory Visit (INDEPENDENT_AMBULATORY_CARE_PROVIDER_SITE_OTHER): Payer: Medicare (Managed Care) | Admitting: Primary Care

## 2017-08-26 VITALS — BP 118/68 | HR 70 | Temp 97.7°F | Wt 278.0 lb

## 2017-08-26 DIAGNOSIS — R42 Dizziness and giddiness: Secondary | ICD-10-CM

## 2017-08-26 NOTE — Patient Instructions (Signed)
Stop by the front desk and speak with either Shirlee LimerickMarion or Zella Ballobin regarding your referral to ENT.   Use your cane when walking.  Rise slowly after resting.  Take a look at the information below.  Please go to the hospital if you develop one sided weakness/numbness, changes in speech, increased dizziness.  It was a pleasure meeting you!  How to Perform the Epley Maneuver The Epley maneuver is an exercise that relieves symptoms of vertigo. Vertigo is the feeling that you or your surroundings are moving when they are not. When you feel vertigo, you may feel like the room is spinning and have trouble walking. Dizziness is a little different than vertigo. When you are dizzy, you may feel unsteady or light-headed. You can do this maneuver at home whenever you have symptoms of vertigo. You can do it up to 3 times a day until your symptoms go away. Even though the Epley maneuver may relieve your vertigo for a few weeks, it is possible that your symptoms will return. This maneuver relieves vertigo, but it does not relieve dizziness. What are the risks? If it is done correctly, the Epley maneuver is considered safe. Sometimes it can lead to dizziness or nausea that goes away after a short time. If you develop other symptoms, such as changes in vision, weakness, or numbness, stop doing the maneuver and call your health care provider. How to perform the Epley maneuver 1. Sit on the edge of a bed or table with your back straight and your legs extended or hanging over the edge of the bed or table. 2. Turn your head halfway toward the affected ear or side. 3. Lie backward quickly with your head turned until you are lying flat on your back. You may want to position a pillow under your shoulders. 4. Hold this position for 30 seconds. You may experience an attack of vertigo. This is normal. 5. Turn your head to the opposite direction until your unaffected ear is facing the floor. 6. Hold this position for 30  seconds. You may experience an attack of vertigo. This is normal. Hold this position until the vertigo stops. 7. Turn your whole body to the same side as your head. Hold for another 30 seconds. 8. Sit back up. You can repeat this exercise up to 3 times a day. Follow these instructions at home:  After doing the Epley maneuver, you can return to your normal activities.  Ask your health care provider if there is anything you should do at home to prevent vertigo. He or she may recommend that you: ? Keep your head raised (elevated) with two or more pillows while you sleep. ? Do not sleep on the side of your affected ear. ? Get up slowly from bed. ? Avoid sudden movements during the day. ? Avoid extreme head movement, like looking up or bending over. Contact a health care provider if:  Your vertigo gets worse.  You have other symptoms, including: ? Nausea. ? Vomiting. ? Headache. Get help right away if:  You have vision changes.  You have a severe or worsening headache or neck pain.  You cannot stop vomiting.  You have new numbness or weakness in any part of your body. Summary  Vertigo is the feeling that you or your surroundings are moving when they are not.  The Epley maneuver is an exercise that relieves symptoms of vertigo.  If the Epley maneuver is done correctly, it is considered safe. You can do it up  to 3 times a day. This information is not intended to replace advice given to you by your health care provider. Make sure you discuss any questions you have with your health care provider. Document Released: 08/21/2013 Document Revised: 07/06/2016 Document Reviewed: 07/06/2016 Elsevier Interactive Patient Education  2017 [employer]

## 2017-08-26 NOTE — Progress Notes (Signed)
Subjective:    Patient ID: Cole LasterCharles Harris, male    DOB: 08/04/1946, 71 y.o.   MRN: [id number]  HPI  [name] is a 71 year old male who presents today with a chief complaint of dizziness.  He presented to [facility] ED on 08/18/17 with a chief complaint of dizziness and nausea with vomiting. He also endorsed a headache, mild hyperglycemia. Dizziness was worse with standing.   During his ED stay he underwent lab testing including lipase, CMP, CBC, UA, and troponin which were grossly normal. He also underwent ECG, orthostatic vitals, and CT head which were unremarkable. His symptoms were thought to be secondary to peripheral vertigo as he responded well to Meclizine during his stay. He was discharged home later that day.  His dizziness improved for a short period of time but returned shortly after his visit. His son is with him today who states that he's noticed his father being off balance when turning too quickly. He's been taking Meclizine twice daily since his ED visit but hasn't noticed much improvement. He also reports ringing in the ears. He denies vomiting, falls, unilateral weakness, numbness/tinlging, speech changes.  He's checking his blood sugars every morning before meals and is getting readings of 130's-140's. He's compliant to his Lanuts 20 units at bedtime and Novolog sliding scale TID for sugars above 150.   Review of Systems  Constitutional: Negative for fever.  Respiratory: Negative for shortness of breath.   Cardiovascular: Negative for chest pain.  Gastrointestinal: Negative for diarrhea, nausea and vomiting.  Neurological: Positive for dizziness. Negative for weakness, numbness and headaches.       Past Medical History:  Diagnosis Date  . Alzheimer's disease with early onset 08/24/2017  . Arthritis   . CAD (coronary artery disease) 08/24/2017  . Depression   . Diabetes mellitus without complication (HCC)   . GERD (gastroesophageal reflux disease)   . Hyperlipidemia     . Hypertension   . Pulmonary hypertension (HCC) 08/24/2017  . Vertebrobasilar insufficiency 08/24/2017     Social History   Socioeconomic History  . Marital status: Married    Spouse name: Not on file  . Number of children: Not on file  . Years of education: Not on file  . Highest education level: Not on file  Social Needs  . Financial resource strain: Not on file  . Food insecurity - worry: Not on file  . Food insecurity - inability: Not on file  . Transportation needs - medical: Not on file  . Transportation needs - non-medical: Not on file  Occupational History  . Not on file  Tobacco Use  . Smoking status: Former Games developermoker  . Smokeless tobacco: Never Used  Substance and Sexual Activity  . Alcohol use: No    Frequency: Never  . Drug use: No  . Sexual activity: No    Birth control/protection: Cervical cap  Other Topics Concern  . Not on file  Social History Narrative  . Not on file    Past Surgical History:  Procedure Laterality Date  . BACK SURGERY  1998  . INTERNAL TENS UNIT  2002  . TONSILLECTOMY  1955    Family History  Problem Relation Age of Onset  . Arthritis Mother   . Arthritis Father   . Hearing loss Father   . Heart disease Father   . Hyperlipidemia Father     No Known Allergies  Current Outpatient Medications on File Prior to Visit  Medication Sig Dispense Refill  .  atorvastatin (LIPITOR) 20 MG tablet Take 20 mg by mouth daily.    Cole Harris. buPROPion (WELLBUTRIN XL) 150 MG 24 hr tablet Take 150 mg by mouth daily.    . Cholecalciferol (VITAMIN D3) 50000 units TABS Take 50,000 Units by mouth once a week.    . Continuous Blood Gluc Sensor (FREESTYLE LIBRE SENSOR SYSTEM) MISC 1 Units by Does not apply route as needed. 3 each 5  . escitalopram (LEXAPRO) 20 MG tablet Take 20 mg by mouth daily.    . fenofibrate (TRICOR) 145 MG tablet Take 145 mg by mouth daily.    . furosemide (LASIX) 40 MG tablet Take 40 mg by mouth.    . insulin aspart (NOVOLOG) 100  UNIT/ML injection Inject 8 Units into the skin 3 (three) times daily before meals.    . insulin glargine (LANTUS) 100 UNIT/ML injection Inject 20 Units into the skin at bedtime.     . irbesartan (AVAPRO) 300 MG tablet Take 300 mg by mouth daily.    . meclizine (ANTIVERT) 25 MG tablet Take 1 tablet (25 mg total) by mouth 3 (three) times daily as needed for dizziness or nausea. 30 tablet 1  . Memantine HCl-Donepezil HCl (NAMZARIC) 28-10 MG CP24 Take 1 capsule by mouth daily.    . Misc. Devices (ADJUSTABLE ALUMINUM CANE 3/4") MISC 1 Units by Does not apply route as needed. 1 each 0  . niacin (NIASPAN) 500 MG CR tablet Take 500 mg by mouth at bedtime.    Cole Harris. omeprazole (PRILOSEC) 20 MG capsule Take 20 mg by mouth daily.    . potassium chloride SA (K-DUR,KLOR-CON) 20 MEQ tablet Take 20 mEq by mouth once.    . sitaGLIPtin-metformin (JANUMET) 50-1000 MG tablet Take 1 tablet by mouth 2 (two) times daily with a meal.     No current facility-administered medications on file prior to visit.     BP 118/68 (BP Location: Left Arm, Patient Position: Sitting, Cuff Size: Large)   Pulse 70   Temp 97.7 F (36.5 C) (Oral)   Wt 278 lb (126.1 kg)   SpO2 95%   BMI 41.05 kg/m    Objective:   Physical Exam  Constitutional: He is oriented to person, place, and time. He appears well-nourished.  HENT:  Right Ear: Tympanic membrane and ear canal normal.  Left Ear: Tympanic membrane and ear canal normal.  Eyes: EOM are normal. Pupils are equal, round, and reactive to light.  Neck: Neck supple.  Cardiovascular: Normal rate and regular rhythm.  Pulmonary/Chest: Effort normal and breath sounds normal.  Neurological: He is alert and oriented to person, place, and time. No cranial nerve deficit.  No facial drooping, arm drift, slurred speech.  Skin: Skin is warm and dry.          Assessment & Plan:  Vertigo:  Evaluated in the ED on 08/18/17, full work up negative for CVA, infection, anemia, significant  hyperglycemia, acute coronary syndrome. Exam today overall unremarkable and suggests vertigo. Discussed to rise slowly when standing, use cane for stability to prevent falls. Referral placed to ENT for further evaluation and treatment. Handout provided regarding Epley's Manuevers. Strict return precautions provided.  All hospital labs, imaging, notes reviewed.  Morrie Sheldonlark,Jaeden Messer Kendal, NP

## 2017-09-02 NOTE — Progress Notes (Signed)
I reviewed health advisor's note, was available for consultation, and agree with documentation and plan.  

## 2017-09-21 ENCOUNTER — Other Ambulatory Visit: Payer: Self-pay | Admitting: Family Medicine

## 2017-09-21 MED ORDER — FREESTYLE LIBRE 14 DAY SENSOR MISC: 1.0000 [IU] | 5 refills | Status: DC

## 2017-09-28 ENCOUNTER — Telehealth: Payer: Self-pay | Admitting: Family Medicine

## 2017-09-28 MED ORDER — FREESTYLE LIBRE 14 DAY READER DEVI: 1.0000 | 0 refills | Status: DC

## 2017-09-28 NOTE — Telephone Encounter (Signed)
Copied from CRM [phone]. Topic: Quick Communication - See Telephone Encounter >> Sep 28, 2017  9:18 AM Clack, Princella PellegriniJessica D wrote: CRM for notification. See Telephone encounter for:  Steele SizerDolly from [facility] states they need the freestyle libre reader.  09/28/17.

## 2017-09-28 NOTE — Telephone Encounter (Signed)
Called and spoke with [facility] pharmacy. Pharmacist states that they received a Rx for 2 of the Freestyle 14 day Sensors but did not get the Reader Device Rx. This has been sent in. Nothing further needed.

## 2017-10-18 ENCOUNTER — Telehealth: Payer: Self-pay | Admitting: Internal Medicine

## 2017-10-18 NOTE — Telephone Encounter (Signed)
Patient has not been contacted about getting o2 for the home   He qualified for needing it per wife but had to return supply to [employer] and is now without here in [place]   Please call to discuss

## 2017-10-18 NOTE — Telephone Encounter (Signed)
Returned call to patient and made aware per insurance guidelines he will need another office visit before and ONO will have to be re-ordered within 30 days of visit.  Pt states he is doing will with out 02 right now so he will call us back when he decides to proceed. Nothing further needed.ss

## 2017-10-28 ENCOUNTER — Ambulatory Visit (INDEPENDENT_AMBULATORY_CARE_PROVIDER_SITE_OTHER): Payer: PRIVATE HEALTH INSURANCE | Admitting: Family Medicine

## 2017-10-28 ENCOUNTER — Encounter (INDEPENDENT_AMBULATORY_CARE_PROVIDER_SITE_OTHER): Payer: Self-pay

## 2017-10-28 ENCOUNTER — Encounter: Payer: Self-pay | Admitting: Family Medicine

## 2017-10-28 VITALS — BP 124/62 | HR 87 | Temp 98.3°F | Wt 290.2 lb

## 2017-10-28 DIAGNOSIS — E1165 Type 2 diabetes mellitus with hyperglycemia: Secondary | ICD-10-CM

## 2017-10-28 DIAGNOSIS — G4733 Obstructive sleep apnea (adult) (pediatric): Secondary | ICD-10-CM | POA: Diagnosis not present

## 2017-10-28 DIAGNOSIS — Z794 Long term (current) use of insulin: Secondary | ICD-10-CM

## 2017-10-28 MED ORDER — INSULIN GLARGINE 100 UNIT/ML ~~LOC~~ SOLN: 20.0000 [IU] | Freq: Every day | SUBCUTANEOUS | 5 refills | Status: DC

## 2017-10-28 NOTE — Progress Notes (Signed)
   Subjective:    Patient ID: Cole Harris, male    DOB: 02/27/1946, 72 y.o.   MRN: [id number]  HPI This is a 72 yo male, accompanied by his wife and daughter in law.    Diabetes- Checking blood sugars at home and running 80- 120s. Readings have improved since increase in lantus dose to 20 units nightly.   OSA with BiPAP. Had to return his oxygen concentrator to [employer]. Needs it with his BiPap. Sees Dr. Nicholos Harris (pulmonary).  Occasional cough, no SOB or wheeze. No chest pain. No abdominal pain, nausea/vomiting/diarrhea or constipation.   Right knee pain comes and goes. No recent falls.     Past Medical History:  Diagnosis Date  . Alzheimer's disease with early onset 08/24/2017  . Arthritis   . CAD (coronary artery disease) 08/24/2017  . Depression   . Diabetes mellitus without complication (HCC)   . GERD (gastroesophageal reflux disease)   . Hyperlipidemia   . Hypertension   . Pulmonary hypertension (HCC) 08/24/2017  . Vertebrobasilar insufficiency 08/24/2017   Past Surgical History:  Procedure Laterality Date  . BACK SURGERY  1998  . INTERNAL TENS UNIT  2002  . TONSILLECTOMY  1955   Family History  Problem Relation Age of Onset  . Arthritis Mother   . Arthritis Father   . Hearing loss Father   . Heart disease Father   . Hyperlipidemia Father    Social History   Tobacco Use  . Smoking status: Former Games developermoker  . Smokeless tobacco: Never Used  Substance Use Topics  . Alcohol use: No    Frequency: Never  . Drug use: No      Review of Systems Per HPI    Objective:   Physical Exam  Constitutional: He appears well-developed and well-nourished. No distress.  Obese.   HENT:  Head: Normocephalic and atraumatic.  Eyes: Conjunctivae are normal.  Neck: Normal range of motion. Neck supple.  Cardiovascular: Normal rate, regular rhythm and normal heart sounds.  Pulmonary/Chest: Effort normal and breath sounds normal.  Neurological: He is alert.  Interactive,  answers questions appropriately.   Skin: Skin is warm and dry. He is not diaphoretic.  Psychiatric: He has a normal mood and affect. His behavior is normal. Judgment and thought content normal.  Vitals reviewed.    BP 124/62   Pulse 87   Temp 98.3 F (36.8 C) (Oral)   Wt 290 lb 4 oz (131.7 kg)   SpO2 94%   BMI 42.86 kg/m  Wt Readings from Last 3 Encounters:  10/28/17 290 lb 4 oz (131.7 kg)  08/26/17 278 lb (126.1 kg)  08/18/17 290 lb (131.5 kg)        Assessment & Plan:  1. Type 2 diabetes mellitus with hyperglycemia, with long-term current use of insulin (HCC) - insulin glargine (LANTUS) 100 UNIT/ML injection; Inject 0.2 mLs (20 Units total) into the skin at bedtime.  Dispense: 10 mL; Refill: 5 - Hemoglobin A1c  2. OSA treated with BiPAP - [name]'s daughter in law will contact Dr. Carolyn Harris's office regarding getting the appropriate equipment. She will let me know if she has any difficulties.   - follow up in 6 months Olean Reeeborah Gessner, FNP-BC  [facility], [facility]  10/31/2017 1:02 PM

## 2017-10-28 NOTE — Patient Instructions (Signed)
Please send Dr. Carolyn Stareamachandran's office a mychart message about your BIPAP oxygen.

## 2017-10-29 LAB — HEMOGLOBIN A1C
EAG (MMOL/L): 9.2 (calc)
Hgb A1c MFr Bld: 7.4 % of total Hgb — ABNORMAL HIGH (ref <5.7)
Mean Plasma Glucose: 166 (calc)

## 2017-11-01 ENCOUNTER — Ambulatory Visit (INDEPENDENT_AMBULATORY_CARE_PROVIDER_SITE_OTHER): Payer: Medicare (Managed Care) | Admitting: Internal Medicine

## 2017-11-01 ENCOUNTER — Encounter: Payer: Self-pay | Admitting: Internal Medicine

## 2017-11-01 VITALS — BP 130/82 | HR 85 | Resp 16 | Ht 69.0 in | Wt 290.0 lb

## 2017-11-01 DIAGNOSIS — J449 Chronic obstructive pulmonary disease, unspecified: Secondary | ICD-10-CM | POA: Diagnosis not present

## 2017-11-01 DIAGNOSIS — G4733 Obstructive sleep apnea (adult) (pediatric): Secondary | ICD-10-CM

## 2017-11-01 NOTE — Patient Instructions (Signed)
Will need chest xray and pulmonary function test, ordered previously.

## 2017-11-01 NOTE — Progress Notes (Signed)
[facility] [place] [facility] Consultation      Assessment and Plan:  Obstructive sleep apnea with sleep-related hypoxemia. - Continue BiPAP at 18/9. --AHI increased to 26 today with recurrence of daytime sleepiness, will check overnight oximetry on CPAP and room air, and add oxygen as indicated.  He will follow-up in approximately 6 weeks time at that time we will see if his AHI and daytime sleepiness have improved, if not we may need to perform a BiPAP titration test  Dyspnea on exertion. -Multifactorial due to obesity, deconditioning/debility, as well as possible COPD. - We will check pulmonary function test to look for evidence of COPD, as well as baseline chest x-ray.  Orders Placed This Encounter  Procedures  . Pulse oximetry, overnight    Return in about 6 weeks (around 12/13/2017).   Date: 11/01/2017  MRN# [id number] Cole Harris 1946/05/25    Cole Harris is a 72 y.o. old male seen in consultation for chief complaint of:    Chief Complaint  Patient presents with  . Sleep Apnea    Pt has been taking mask off since he has been off 02. He feels like he is not getting enough air.  Marland Kitchen COPD    Pt had to turn 02 in because it was from [place]. Pt needs to get 02 in [place].    HPI:   The patient is a 72 yo male, he is present with his wife and daughter who gives some of the history.  He has had a sleep study in the past and he has a CPAP, and is on it every night. He was started on it for more than 20 years, current machine is about 1 or 71 yrs old.  He was previously on oxygen with CPAP, however after recent move here from [place] he had to surrender his oxygen to his home care company there before we could start oxygen here.  In addition his insurance required the oxygen order within 30 days of an office visit, therefore he is back here today so that we can order an overnight oximetry. He has been having trouble tolerating the pressure because he feels he is not  getting enough air.  Last visit we ordered a chest x-ray, overnight oximetry, pulmonary function test, these tests have not yet been performed.  He notes that he is more sleepy today than he has been in the last few months, he has been more sleepy recently.  He thinks that the CPAP is helping but he still gets tired easily. 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    He feels that his breathing is doing well but he gets winded easily. He can do most ADL's. He smoked last 25 years ago, he smoked 1 to 2 ppd at that time, he stopped after his wife had a heart attack.  He has been told that he has COPD.   Review of download data in 90 days as of 10/31/17.  Use greater than 4 hours is 89/90 days.  Average usage on days used is 10 hours 50 minutes.  Setting is 18/9.  Residual AHI is 26.4, which are obstructive. Review of download data 90 days as of 08/15/17; 88/90 days, avg 10 hrs per night. bipap settings of 18/9; residual AHI is 6.3.   Medication:    Current Outpatient Medications:  .  atorvastatin (LIPITOR) 20  MG tablet, Take 20 mg by mouth daily., Disp: , Rfl:  .  buPROPion (WELLBUTRIN XL) 150 MG 24 hr tablet, Take 150 mg by mouth daily., Disp: , Rfl:  .  Cholecalciferol (VITAMIN D3) 50000 units TABS, Take 50,000 Units by mouth once a week., Disp: , Rfl:  .  Continuous Blood Gluc Receiver (FREESTYLE LIBRE 14 DAY READER) DEVI, 1 each by Does not apply route as directed. Check blood sugar daily as directed., Disp: 1 Device, Rfl: 0 .  Continuous Blood Gluc Sensor (FREESTYLE LIBRE 14 DAY SENSOR) MISC, 1 Units by Does not apply route every 14 (fourteen) days., Disp: 2 each, Rfl: 5 .  escitalopram (LEXAPRO) 20 MG tablet, Take 20 mg by mouth daily., Disp: , Rfl:  .  fenofibrate (TRICOR) 145 MG tablet, Take 145 mg by mouth daily., Disp: , Rfl:  .  furosemide (LASIX) 40 MG tablet, Take 40 mg by mouth., Disp:  , Rfl:  .  insulin aspart (NOVOLOG) 100 UNIT/ML injection, Inject 8 Units into the skin 3 (three) times daily before meals., Disp: , Rfl:  .  insulin glargine (LANTUS) 100 UNIT/ML injection, Inject 0.2 mLs (20 Units total) into the skin at bedtime., Disp: 10 mL, Rfl: 5 .  irbesartan (AVAPRO) 300 MG tablet, Take 300 mg by mouth daily., Disp: , Rfl:  .  meclizine (ANTIVERT) 25 MG tablet, Take 1 tablet (25 mg total) by mouth 3 (three) times daily as needed for dizziness or nausea., Disp: 30 tablet, Rfl: 1 .  Memantine HCl-Donepezil HCl (NAMZARIC) 28-10 MG CP24, Take 1 capsule by mouth daily., Disp: , Rfl:  .  Misc. Devices (ADJUSTABLE ALUMINUM CANE 3/4") MISC, 1 Units by Does not apply route as needed., Disp: 1 each, Rfl: 0 .  niacin (NIASPAN) 500 MG CR tablet, Take 500 mg by mouth at bedtime., Disp: , Rfl:  .  omeprazole (PRILOSEC) 20 MG capsule, Take 20 mg by mouth daily., Disp: , Rfl:  .  potassium chloride SA (K-DUR,KLOR-CON) 20 MEQ tablet, Take 20 mEq by mouth once., Disp: , Rfl:  .  sitaGLIPtin-metformin (JANUMET) 50-1000 MG tablet, Take 1 tablet by mouth 2 (two) times daily with a meal., Disp: , Rfl:    Allergies:  Patient has no known allergies.  Review of Systems: Gen:  Denies  fever, sweats, chills HEENT: Denies blurred vision, double vision. bleeds, sore throat Cvc:  No dizziness, chest pain. Resp:   Denies cough or sputum production, shortness of breath Gi: Denies swallowing difficulty, stomach pain. Gu:  Denies bladder incontinence, burning urine Ext:   No Joint pain, stiffness. Skin: No skin rash,  hives  Endoc:  No polyuria, polydipsia. Psych: No depression, insomnia. Other:  All other systems were reviewed with the patient and were negative other that what is mentioned in the HPI.   Physical Examination:   VS: BP 130/82 (BP Location: Left Arm, Cuff Size: Large)   Pulse 85   Resp 16   Ht 5\' 9"  (1.753 m)   Wt 290 lb (131.5 kg)   SpO2 97%   BMI 42.83 kg/m   General  Appearance: No distress  Neuro:without focal findings,  speech normal,  HEENT: PERRLA, EOM intact.   Pulmonary: normal breath sounds, No wheezing.  CardiovascularNormal S1,S2.  No m/r/g.   Abdomen: Benign, Soft, non-tender. Renal:  No costovertebral tenderness  GU:  No performed at this time. Endoc: No evident thyromegaly, no signs of acromegaly. Skin:   warm, no rashes, no ecchymosis  Extremities: normal, no  cyanosis, clubbing.  Other findings:    LABORATORY PANEL:   CBC No results for input(s): WBC, HGB, HCT, PLT in the last 168 hours. ------------------------------------------------------------------------------------------------------------------  Chemistries  No results for input(s): NA, K, CL, CO2, GLUCOSE, BUN, CREATININE, CALCIUM, MG, AST, ALT, ALKPHOS, BILITOT in the last 168 hours.  Invalid input(s): GFRCGP ------------------------------------------------------------------------------------------------------------------  Cardiac Enzymes No results for input(s): TROPONINI in the last 168 hours. ------------------------------------------------------------  RADIOLOGY:  No results found.     Thank  you for the consultation and for allowing [facility] to assist in the care of your patient. Our recommendations are noted above.  Please contact us if we can be of further service.   Wells Guiles, MD.  Board Certified in Internal Medicine, Pulmonary Medicine, Critical Care Medicine, and Sleep Medicine.  [facility] Office Number: [phone]  Santiago Glad, M.D.  Billy Fischer, M.D  11/01/2017

## 2017-11-04 ENCOUNTER — Telehealth: Payer: Self-pay | Admitting: Internal Medicine

## 2017-11-04 NOTE — Telephone Encounter (Signed)
Patient still waiting on hh to call and set up appt for ono   Please call to discuss delay

## 2017-11-04 NOTE — Telephone Encounter (Signed)
Called patient and pt's wife stated that [facility]PS dropped off pulse ox this morning. Nothing else needed at this time. Rhonda J Cobb

## 2017-11-14 ENCOUNTER — Encounter (INDEPENDENT_AMBULATORY_CARE_PROVIDER_SITE_OTHER): Payer: Self-pay

## 2017-11-15 ENCOUNTER — Encounter: Payer: Self-pay | Admitting: Internal Medicine

## 2017-11-17 ENCOUNTER — Telehealth: Payer: Self-pay | Admitting: *Deleted

## 2017-11-17 ENCOUNTER — Telehealth: Payer: Self-pay | Admitting: Family Medicine

## 2017-11-17 NOTE — Telephone Encounter (Unsigned)
Copied from CRM [phone]#[id number]. Topic: Quick Communication - See Telephone Encounter >> Nov 17, 2017  3:07 PM Waymon AmatoBurton, Donna F wrote: CRM for notification. See Telephone encounter for: 11/17/17.pt daughter in law is calling to speak with someone regarding pt and him sleeping all the time and the results from the pulmonary appt  She is on pt DPR  Best number -Isidor Holtskaren Choquette [phone]

## 2017-11-17 NOTE — Telephone Encounter (Signed)
I spoke with Clydie BraunKaren (DPR signed) and pt is tired all the time and sleeping a lot during the day. [place] Pt saw pulmonologist here. The pulse ox never got below 88%. Pt had 26 episodes of sleep apnea with C pap. Clydie BraunKaren wanted D Leone PayorGessner FNP be aware of what is going on. Clydie BraunKaren is waiting to hear from pulmonologist. Clydie BraunKaren will call back if needed.FYI to Harlin Heys Gessner FNP.

## 2017-11-17 NOTE — Telephone Encounter (Signed)
Noted. I see they have a call in to pulmonary as well.

## 2017-11-17 NOTE — Telephone Encounter (Signed)
Spoke with patient's daughter in-law and gave normal  ONO results. She states something is going on because there has been an increase in apnea episode. Patient is continually have excessive daytime fatigue. Please advise.

## 2017-11-21 ENCOUNTER — Telehealth: Payer: Self-pay | Admitting: *Deleted

## 2017-11-21 NOTE — Telephone Encounter (Signed)
Left voicemail for Cole BraunKaren daughter-in-law that results of ONO are normal. We will see Mr. [name] as scheduled in 2 weeks for further evaluation.

## 2017-11-21 NOTE — Telephone Encounter (Signed)
appt scheduled 12/05/17

## 2017-11-21 NOTE — Telephone Encounter (Signed)
Follow up in 2 weeks with download, will decide on further management at that time.

## 2017-12-02 NOTE — Progress Notes (Signed)
[facility]      Assessment and Plan:  Obstructive sleep apnea with sleep-related hypoxemia. - Continue BiPAP at 18/9. --Continue symptoms of daytime sleepiness, has trouble tolerating current settings, and often takes the mask off in the middle the night without replacing it.  On previous download AHI was noted to be increased to 26. -Was unable to obtain a download from his card today, patient will need referral to a DME company to repair his machine.   Dyspnea on exertion. -Multifactorial due to obesity, deconditioning/debility, as well as possible COPD.  Previously ordered bony function test and baseline chest x-ray.   Orders Placed This Encounter  Procedures  . Bipap titration  . PSG SLEEP STUDY    Return in about 6 weeks (around 01/16/2018).   Date: 12/02/2017  MRN# [id number] Cole ChaletCharles V Harris 05/11/1946    Cole Harris is a 72 y.o. old male seen in consultation for chief complaint of:    Chief Complaint  Patient presents with  . Sleep Apnea    pt here c/o daytime sleepiness family report since d/c of 02 he pulls mask for cpap offf.    HPI:   The patient is a 72 yo male, he is present with his wife and daughter who gives some of the history.  He has a long-standing history of obstructive sleep apnea, has been on CPAP for more than 20 years.  He was last seen in order to his reestablish oxygen use with his CPAP.  An overnight oximetry was ordered on CPAP, this did not show overnight hypoxemia therefore oxygen was discontinued. He is still getting used to the CPAP, he continues to have some daytime sleepiness, and having trouble tolerating the pressure.  He feels tired much of the time and waking up frequently, often taking often taking off the mask, he feels that he is getting smothered and then takes the mask off. He has had current machine for about a year, they moved here from [place] about 6 months ago.   Review of download data in 90  days as of 10/31/17.  Use greater than 4 hours is 89/90 days.  Average usage on days used is 10 hours 50 minutes.  Setting is 18/9.  Residual AHI is 26.4, which are obstructive. Review of download data 90 days as of 08/15/17; 88/90 days, avg 10 hrs per night. bipap settings of 18/9; residual AHI is 6.3.   Medication:    Current Outpatient Medications:  .  atorvastatin (LIPITOR) 20 MG tablet, Take 20 mg by mouth daily., Disp: , Rfl:  .  buPROPion (WELLBUTRIN XL) 150 MG 24 hr tablet, Take 150 mg by mouth daily., Disp: , Rfl:  .  Cholecalciferol (VITAMIN D3) 50000 units TABS, Take 50,000 Units by mouth once a week., Disp: , Rfl:  .  Continuous Blood Gluc Receiver (FREESTYLE LIBRE 14 DAY READER) DEVI, 1 each by Does not apply route as directed. Check blood sugar daily as directed., Disp: 1 Device, Rfl: 0 .  Continuous Blood Gluc Sensor (FREESTYLE LIBRE 14 DAY SENSOR) MISC, 1 Units by Does not apply route every 14 (fourteen) days., Disp: 2 each, Rfl: 5 .  escitalopram (LEXAPRO) 20 MG tablet, Take 20 mg by mouth daily., Disp: , Rfl:  .  fenofibrate (TRICOR) 145 MG tablet, Take 145 mg by mouth daily., Disp: , Rfl:  .  furosemide (LASIX) 40 MG tablet, Take 40 mg by mouth., Disp: , Rfl:  .  insulin aspart (NOVOLOG) 100  UNIT/ML injection, Inject 8 Units into the skin 3 (three) times daily before meals., Disp: , Rfl:  .  insulin glargine (LANTUS) 100 UNIT/ML injection, Inject 0.2 mLs (20 Units total) into the skin at bedtime., Disp: 10 mL, Rfl: 5 .  irbesartan (AVAPRO) 300 MG tablet, Take 300 mg by mouth daily., Disp: , Rfl:  .  meclizine (ANTIVERT) 25 MG tablet, Take 1 tablet (25 mg total) by mouth 3 (three) times daily as needed for dizziness or nausea., Disp: 30 tablet, Rfl: 1 .  Memantine HCl-Donepezil HCl (NAMZARIC) 28-10 MG CP24, Take 1 capsule by mouth daily., Disp: , Rfl:  .  Misc. Devices (ADJUSTABLE ALUMINUM CANE 3/4") MISC, 1 Units by Does not apply route as needed., Disp: 1 each, Rfl: 0 .  niacin  (NIASPAN) 500 MG CR tablet, Take 500 mg by mouth at bedtime., Disp: , Rfl:  .  omeprazole (PRILOSEC) 20 MG capsule, Take 20 mg by mouth daily., Disp: , Rfl:  .  potassium chloride SA (K-DUR,KLOR-CON) 20 MEQ tablet, Take 20 mEq by mouth once., Disp: , Rfl:  .  sitaGLIPtin-metformin (JANUMET) 50-1000 MG tablet, Take 1 tablet by mouth 2 (two) times daily with a meal., Disp: , Rfl:    Allergies:  Patient has no known allergies.  Review of Systems: Gen:  Denies  fever, sweats, chills HEENT: Denies blurred vision, double vision. bleeds, sore throat Cvc:  No dizziness, chest pain. Resp:   Denies cough or sputum production, shortness of breath Gi: Denies swallowing difficulty, stomach pain. Gu:  Denies bladder incontinence, burning urine Ext:   No Joint pain, stiffness. Skin: No skin rash,  hives  Endoc:  No polyuria, polydipsia. Psych: No depression, insomnia. Other:  All other systems were reviewed with the patient and were negative other that what is mentioned in the HPI.   Physical Examination:   VS: BP 122/70 (BP Location: Left Arm, Cuff Size: Large)   Pulse (!) 109   Resp 16   Ht 5\' 9"  (1.753 m)   Wt 291 lb (132 kg)   SpO2 95%   BMI 42.97 kg/m   General Appearance: No distress  Neuro:without focal findings,  speech normal,  HEENT: PERRLA, EOM intact.   Pulmonary: normal breath sounds, No wheezing.  CardiovascularNormal S1,S2.  No m/r/g.   Abdomen: Benign, Soft, non-tender. Renal:  No costovertebral tenderness  GU:  No performed at this time. Endoc: No evident thyromegaly, no signs of acromegaly. Skin:   warm, no rashes, no ecchymosis  Extremities: normal, no cyanosis, clubbing.  Other findings:    LABORATORY PANEL:   CBC No results for input(s): WBC, HGB, HCT, PLT in the last 168 hours. ------------------------------------------------------------------------------------------------------------------  Chemistries  No results for input(s): NA, K, CL, CO2, GLUCOSE,  BUN, CREATININE, CALCIUM, MG, AST, ALT, ALKPHOS, BILITOT in the last 168 hours.  Invalid input(s): GFRCGP ------------------------------------------------------------------------------------------------------------------  Cardiac Enzymes No results for input(s): TROPONINI in the last 168 hours. ------------------------------------------------------------  RADIOLOGY:  No results found.     Thank  you for the consultation and for allowing [facility] to assist in the care of your patient. Our recommendations are noted above.  Please contact us if we can be of further service.   Wells Guiles, MD.  Board Certified in Internal Medicine, Pulmonary Medicine, Critical Care Medicine, and Sleep Medicine.  [facility] Office Number: [phone]  Santiago Glad, M.D.  Billy Fischer, M.D  12/02/2017

## 2017-12-05 ENCOUNTER — Encounter: Payer: Self-pay | Admitting: Internal Medicine

## 2017-12-05 ENCOUNTER — Ambulatory Visit (INDEPENDENT_AMBULATORY_CARE_PROVIDER_SITE_OTHER): Payer: Medicare (Managed Care) | Admitting: Internal Medicine

## 2017-12-05 VITALS — BP 122/70 | HR 109 | Resp 16 | Ht 69.0 in | Wt 291.0 lb

## 2017-12-05 DIAGNOSIS — G4733 Obstructive sleep apnea (adult) (pediatric): Secondary | ICD-10-CM | POA: Diagnosis not present

## 2017-12-05 DIAGNOSIS — J449 Chronic obstructive pulmonary disease, unspecified: Secondary | ICD-10-CM | POA: Diagnosis not present

## 2017-12-05 NOTE — Addendum Note (Signed)
Addended by: Janean SarkSNIPES, SONYA K on: 12/05/2017 04:43 PM   Modules accepted: Orders

## 2017-12-05 NOTE — Patient Instructions (Addendum)
Will send for Bipap titration study with oxygen addition as needed.  Will refer you to a local DME company to repair/replace your machine/download card.

## 2017-12-06 ENCOUNTER — Ambulatory Visit: Payer: Medicare Other

## 2017-12-12 ENCOUNTER — Telehealth: Payer: Self-pay | Admitting: Internal Medicine

## 2017-12-12 NOTE — Telephone Encounter (Signed)
LMOAM for [facility]asa [facility]ney [facility] my call. Rhonda J Cobb

## 2017-12-12 NOTE — Telephone Encounter (Signed)
Cole Harris with [facility] calling about referral for BIPAP transition  He states they are not in network for patient insurance to cover.  FYI

## 2017-12-13 ENCOUNTER — Ambulatory Visit: Payer: Medicare Other | Admitting: Internal Medicine

## 2017-12-13 NOTE — Telephone Encounter (Signed)
[facility]odney [facility], they are unable to take patient's primary insurance. The only DME that does is [employer].  Will send order to [employer] to process.  Rhonda J Cobb

## 2017-12-13 NOTE — Telephone Encounter (Signed)
Called and spoke with pt's wife and she is aware that DME company was changed to [facility] for BiPap Support and Supplies.  [facility], Attention Tiffany. Nothing else needed at this time. Rhonda J Cobb

## 2017-12-15 ENCOUNTER — Ambulatory Visit: Payer: Medicare Other | Attending: Internal Medicine

## 2017-12-15 DIAGNOSIS — G4733 Obstructive sleep apnea (adult) (pediatric): Secondary | ICD-10-CM | POA: Insufficient documentation

## 2017-12-15 DIAGNOSIS — J449 Chronic obstructive pulmonary disease, unspecified: Secondary | ICD-10-CM | POA: Insufficient documentation

## 2017-12-15 DIAGNOSIS — R0609 Other forms of dyspnea: Secondary | ICD-10-CM | POA: Diagnosis present

## 2017-12-15 MED ORDER — ALBUTEROL SULFATE (2.5 MG/3ML) 0.083% IN NEBU
2.5000 mg | INHALATION_SOLUTION | Freq: Once | RESPIRATORY_TRACT | Status: AC
  Administered 2017-12-15: 2.5 mg via RESPIRATORY_TRACT
  Filled 2017-12-15: qty 3

## 2017-12-23 DIAGNOSIS — G4733 Obstructive sleep apnea (adult) (pediatric): Secondary | ICD-10-CM | POA: Diagnosis not present

## 2017-12-26 ENCOUNTER — Telehealth: Payer: Self-pay | Admitting: *Deleted

## 2017-12-26 DIAGNOSIS — G4733 Obstructive sleep apnea (adult) (pediatric): Secondary | ICD-10-CM

## 2017-12-26 NOTE — Telephone Encounter (Signed)
Called to give results of sleep study. Orders will be placed for bi-pap therapy. Spoke with patient's daughter in law (results given) Nothing further needed.

## 2017-12-29 ENCOUNTER — Telehealth: Payer: Self-pay | Admitting: Internal Medicine

## 2017-12-29 NOTE — Telephone Encounter (Signed)
Sleep study showed the AHI was 27.

## 2017-12-29 NOTE — Telephone Encounter (Signed)
[employer] calling  States patient needs a new baseline sleep study The one that was provided was inconsistent - no AHI Study will need to be repeated  Please contact [phone] with any questions

## 2017-12-29 NOTE — Telephone Encounter (Signed)
Gilda with [facility] is stating that pt's original baseline study from [facility] did not show any AHI that patient would need to repeat baseline study.  Please advise. Rhonda J Cobb

## 2018-01-04 NOTE — Telephone Encounter (Signed)
Spoke with Clydie Braun who will have original sleep study faxed from [facility].

## 2018-01-04 NOTE — Telephone Encounter (Signed)
Patient daughter in law Cole Harris returning call  Please call

## 2018-01-04 NOTE — Telephone Encounter (Signed)
Called patient daughter in law Clydie Braun. LMTCB need to obtain original sleep study from [facility] per DME [facility].

## 2018-01-06 NOTE — Telephone Encounter (Signed)
Left message for return call.

## 2018-01-09 ENCOUNTER — Telehealth: Payer: Self-pay | Admitting: Internal Medicine

## 2018-01-09 ENCOUNTER — Encounter: Payer: Self-pay | Admitting: *Deleted

## 2018-01-09 DIAGNOSIS — G4733 Obstructive sleep apnea (adult) (pediatric): Secondary | ICD-10-CM

## 2018-01-09 NOTE — Telephone Encounter (Signed)
Sent my chart message. Will follow via mychart. Message closed.

## 2018-01-09 NOTE — Telephone Encounter (Signed)
Patient daughter in law Clydie Braun calling Would like to discuss mychart message that patient received Please call - best number to reach is [phone]

## 2018-01-09 NOTE — Telephone Encounter (Signed)
Spoke with Clydie Braun there are no records of sleep study as former pcp doesn't have any more paper records. Split night ordered.

## 2018-01-17 ENCOUNTER — Ambulatory Visit: Payer: Medicare Other | Attending: Internal Medicine

## 2018-01-17 DIAGNOSIS — G4733 Obstructive sleep apnea (adult) (pediatric): Secondary | ICD-10-CM | POA: Diagnosis present

## 2018-01-20 DIAGNOSIS — G4733 Obstructive sleep apnea (adult) (pediatric): Secondary | ICD-10-CM | POA: Diagnosis not present

## 2018-01-24 ENCOUNTER — Other Ambulatory Visit: Payer: Self-pay | Admitting: Internal Medicine

## 2018-01-24 DIAGNOSIS — G4733 Obstructive sleep apnea (adult) (pediatric): Secondary | ICD-10-CM

## 2018-01-31 ENCOUNTER — Other Ambulatory Visit: Payer: Self-pay | Admitting: Family Medicine

## 2018-02-06 MED ORDER — FREESTYLE LIBRE 14 DAY READER DEVI: 1.0000 | 0 refills | Status: DC

## 2018-02-07 ENCOUNTER — Ambulatory Visit: Payer: PRIVATE HEALTH INSURANCE

## 2018-03-23 ENCOUNTER — Encounter: Payer: Self-pay | Admitting: Family Medicine

## 2018-03-24 ENCOUNTER — Other Ambulatory Visit: Payer: Self-pay | Admitting: Emergency Medicine

## 2018-03-24 MED ORDER — FREESTYLE LIBRE 14 DAY SENSOR MISC: 1.0000 [IU] | 5 refills | Status: DC

## 2018-04-19 ENCOUNTER — Telehealth: Payer: Self-pay

## 2018-04-19 NOTE — Telephone Encounter (Signed)
Copied from CRM [id number]. Topic: General - Other >> Apr 19, 2018  3:06 PM Gerrianne ScalePayne, Angela L wrote: Reason for CRM: pt daughter in law Clydie BraunKaren calling about the RX Continuous Blood Gluc Sensor (FREESTYLE LIBRE 14 DAY SENSOR) MISC stating that the [facility] was waiting on the Chart notes and LMN for insurance to pay for medicine call them at [phone]

## 2018-04-19 NOTE — Telephone Encounter (Signed)
Called and spoken to the rep. Was inform that all they need was last office notes since they already received letter of medical necessity.  Faxed office notes to [phone]

## 2018-05-02 ENCOUNTER — Encounter: Payer: Self-pay | Admitting: Family Medicine

## 2018-05-03 ENCOUNTER — Other Ambulatory Visit: Payer: Self-pay | Admitting: Family Medicine

## 2018-05-03 MED ORDER — FREESTYLE LIBRE 14 DAY SENSOR MISC
1.0000 [IU] | 3 refills | Status: DC
Start: 2018-05-03 — End: 2018-05-03

## 2018-05-03 MED ORDER — FREESTYLE LIBRE 14 DAY SENSOR MISC: 1.0000 [IU] | 3 refills | Status: DC

## 2018-05-03 MED ORDER — FREESTYLE LIBRE 14 DAY READER DEVI: 1.0000 | 0 refills | Status: DC

## 2018-05-05 ENCOUNTER — Ambulatory Visit (INDEPENDENT_AMBULATORY_CARE_PROVIDER_SITE_OTHER): Payer: Medicare (Managed Care) | Admitting: Family Medicine

## 2018-05-05 ENCOUNTER — Encounter: Payer: Self-pay | Admitting: Family Medicine

## 2018-05-05 VITALS — BP 100/60 | HR 82 | Temp 98.7°F | Ht 69.0 in | Wt 276.8 lb

## 2018-05-05 DIAGNOSIS — G4733 Obstructive sleep apnea (adult) (pediatric): Secondary | ICD-10-CM

## 2018-05-05 DIAGNOSIS — E1165 Type 2 diabetes mellitus with hyperglycemia: Secondary | ICD-10-CM

## 2018-05-05 DIAGNOSIS — G300 Alzheimer's disease with early onset: Secondary | ICD-10-CM

## 2018-05-05 DIAGNOSIS — Z23 Encounter for immunization: Secondary | ICD-10-CM

## 2018-05-05 DIAGNOSIS — Z794 Long term (current) use of insulin: Secondary | ICD-10-CM

## 2018-05-05 DIAGNOSIS — Z9989 Dependence on other enabling machines and devices: Secondary | ICD-10-CM

## 2018-05-05 DIAGNOSIS — F0280 Dementia in other diseases classified elsewhere without behavioral disturbance: Secondary | ICD-10-CM

## 2018-05-05 MED ORDER — BUPROPION HCL ER (XL) 150 MG PO TB24: 150.0000 mg | ORAL_TABLET | Freq: Every day | ORAL | 3 refills | Status: DC

## 2018-05-05 MED ORDER — ESCITALOPRAM OXALATE 20 MG PO TABS: 20.0000 mg | ORAL_TABLET | Freq: Every day | ORAL | 3 refills | Status: DC

## 2018-05-05 MED ORDER — ATORVASTATIN CALCIUM 20 MG PO TABS: 20.0000 mg | ORAL_TABLET | Freq: Every day | ORAL | 3 refills | Status: DC

## 2018-05-05 MED ORDER — SITAGLIPTIN PHOS-METFORMIN HCL 50-1000 MG PO TABS: 1.0000 | ORAL_TABLET | Freq: Two times a day (BID) | ORAL | 3 refills | Status: DC

## 2018-05-05 MED ORDER — OMEPRAZOLE 20 MG PO CPDR: 20.0000 mg | DELAYED_RELEASE_CAPSULE | Freq: Every day | ORAL | 3 refills | Status: DC

## 2018-05-05 MED ORDER — IRBESARTAN 300 MG PO TABS: 300.0000 mg | ORAL_TABLET | Freq: Every day | ORAL | 3 refills | Status: DC

## 2018-05-05 MED ORDER — POTASSIUM CHLORIDE CRYS ER 20 MEQ PO TBCR: 20.0000 meq | EXTENDED_RELEASE_TABLET | Freq: Once | ORAL | 3 refills | Status: DC

## 2018-05-05 MED ORDER — MECLIZINE HCL 25 MG PO TABS: 25.0000 mg | ORAL_TABLET | Freq: Three times a day (TID) | ORAL | 1 refills | Status: DC | PRN

## 2018-05-05 MED ORDER — INSULIN GLARGINE 100 UNIT/ML SOLOSTAR PEN: 20.0000 [IU] | PEN_INJECTOR | Freq: Every day | SUBCUTANEOUS | 11 refills | Status: DC

## 2018-05-05 MED ORDER — NIACIN ER (ANTIHYPERLIPIDEMIC) 500 MG PO TBCR: 500.0000 mg | EXTENDED_RELEASE_TABLET | Freq: Every day | ORAL | 3 refills | Status: DC

## 2018-05-05 MED ORDER — FUROSEMIDE 40 MG PO TABS: 40.0000 mg | ORAL_TABLET | Freq: Every day | ORAL | 3 refills | Status: DC

## 2018-05-05 MED ORDER — FENOFIBRATE 145 MG PO TABS: 145.0000 mg | ORAL_TABLET | Freq: Every day | ORAL | 3 refills | Status: DC

## 2018-05-05 NOTE — Patient Instructions (Signed)
Good to see you today  Please go to the lab

## 2018-05-05 NOTE — Progress Notes (Signed)
Subjective:    Patient ID: Cole Harris, male    DOB: [birth year], 72 y.o.   MRN: [id number]  HPI This is a 72 yo male who presents today for follow up of chronic medical conditions. Accompanied by his wife and DIL.  More night time symptoms of wandering. Seems to have more difficulty with CPAP, more fidgeting and removing mask. Feels like he is not getting enough air.  Has upcoming appointment with neurology.  Using lantus about 20 units nightly. Has not been requiring SSI, blood sugars running under 200. No episodes hypoglycemia.  He denies chest pain, SOB, nausea/vomiting/diarrhea/constipation. No dizziness or recent falls.  Appetite good. Daughter in law has been providing healthy meals. Both Cole Harris and his wife have lost weight since moving to [place] and living with son and daughter in Social worker. They rarely eat out.   Past Medical History:  Diagnosis Date  . Alzheimer's disease with early onset 08/24/2017  . Arthritis   . CAD (coronary artery disease) 08/24/2017  . Depression   . Diabetes mellitus without complication (HCC)   . GERD (gastroesophageal reflux disease)   . Hyperlipidemia   . Hypertension   . Pulmonary hypertension (HCC) 08/24/2017  . Vertebrobasilar insufficiency 08/24/2017   Past Surgical History:  Procedure Laterality Date  . BACK SURGERY  1998  . INTERNAL TENS UNIT  2002  . TONSILLECTOMY  1955   Family History  Problem Relation Age of Onset  . Arthritis Mother   . Arthritis Father   . Hearing loss Father   . Heart disease Father   . Hyperlipidemia Father    Social History   Tobacco Use  . Smoking status: Former Games developer  . Smokeless tobacco: Never Used  Substance Use Topics  . Alcohol use: No    Frequency: Never  . Drug use: No      Review of Systems Per HPI    Objective:   Physical Exam  Constitutional: He appears well-developed and well-nourished. No distress.  HENT:  Head: Normocephalic and atraumatic.  Eyes: Conjunctivae are normal.    Cardiovascular: Normal rate, regular rhythm and normal heart sounds.  Pulmonary/Chest: Effort normal and breath sounds normal.  Musculoskeletal: He exhibits no edema.  Gait normal.   Neurological: He is alert.  Answers questions appropriately, makes jokes, participates in conversation.   Skin: Skin is warm and dry. He is not diaphoretic.  Psychiatric: He has a normal mood and affect. His behavior is normal. Judgment and thought content normal.  Vitals reviewed.     BP 100/60 (BP Location: Right Arm, Patient Position: Sitting, Cuff Size: Normal)   Pulse 82   Temp 98.7 F (37.1 C)   Ht 5\' 9"  (1.753 m)   Wt 276 lb 12.8 oz (125.6 kg)   SpO2 96%   BMI 40.88 kg/m  Wt Readings from Last 3 Encounters:  05/05/18 276 lb 12.8 oz (125.6 kg)  12/05/17 291 lb (132 kg)  11/01/17 290 lb (131.5 kg)       Assessment & Plan:  1. Type 2 diabetes mellitus with hyperglycemia, with long-term current use of insulin (HCC) - continue home glucose monitoring - Comprehensive metabolic panel - Hemoglobin A1c - Lipid panel - Microalbumin / creatinine urine ratio  2. OSA on CPAP - will contact equipment provider to check machine and mask fit, if no improvement, will follow up with pulmonary- they were instructed to notify me if they need anything with regards to this  3. Need for influenza vaccination - Flu  vaccine HIGH DOSE PF  4. Early onset Alzheimer's dementia without behavioral disturbance - continue current meds, neurology follow up, encouraged regular physical activity and social activity as tolerated  - follow up on file for CPE/AWV  Olean Ree, FNP-BC  [facility], [facility]  05/10/2018 9:15 AM

## 2018-05-06 LAB — LIPID PANEL
Cholesterol: 137 mg/dL (ref <200)
HDL: 35 mg/dL — ABNORMAL LOW (ref >40)
LDL CHOLESTEROL (CALC): 69 mg/dL
Non-HDL Cholesterol (Calc): 102 mg/dL (calc) (ref <130)
Total CHOL/HDL Ratio: 3.9 (calc) (ref <5.0)
Triglycerides: 239 mg/dL — ABNORMAL HIGH (ref <150)

## 2018-05-06 LAB — COMPREHENSIVE METABOLIC PANEL
AG Ratio: 1.5 (calc) (ref 1.0–2.5)
ALKALINE PHOSPHATASE (APISO): 32 U/L — AB (ref 40–115)
ALT: 14 U/L (ref 9–46)
AST: 16 U/L (ref 10–35)
Albumin: 4.3 g/dL (ref 3.6–5.1)
BUN: 15 mg/dL (ref 7–25)
CALCIUM: 10.2 mg/dL (ref 8.6–10.3)
CO2: 27 mmol/L (ref 20–32)
Chloride: 101 mmol/L (ref 98–110)
Creat: 1.1 mg/dL (ref 0.70–1.18)
GLUCOSE: 142 mg/dL — AB (ref 65–99)
Globulin: 2.8 g/dL (calc) (ref 1.9–3.7)
Potassium: 4.4 mmol/L (ref 3.5–5.3)
Sodium: 138 mmol/L (ref 135–146)
TOTAL PROTEIN: 7.1 g/dL (ref 6.1–8.1)
Total Bilirubin: 0.5 mg/dL (ref 0.2–1.2)

## 2018-05-06 LAB — HEMOGLOBIN A1C
Hgb A1c MFr Bld: 6.4 % of total Hgb — ABNORMAL HIGH (ref <5.7)
MEAN PLASMA GLUCOSE: 137 (calc)
eAG (mmol/L): 7.6 (calc)

## 2018-05-06 LAB — MICROALBUMIN / CREATININE URINE RATIO
CREATININE, URINE: 191 mg/dL (ref 20–320)
MICROALB UR: 0.6 mg/dL
Microalb Creat Ratio: 3 mcg/mg creat (ref <30)

## 2018-05-08 ENCOUNTER — Encounter: Payer: Self-pay | Admitting: Family Medicine

## 2018-05-09 ENCOUNTER — Telehealth: Payer: Self-pay | Admitting: Family Medicine

## 2018-05-09 NOTE — Telephone Encounter (Signed)
Copied from CRM [id number]. Topic: Quick Communication - See Telephone Encounter >> May 09, 2018  3:54 PM Windy Kalata, NT wrote: CRM for notification. See Telephone encounter for: 05/09/18.  Byrd Hesselbach is calling from [facility] and is needing clarification on potassium chloride SA (K-DUR,KLOR-CON) 20 MEQ tablet instructions. Please contact.   Cb# [phone] Reference# [id number]

## 2018-05-10 ENCOUNTER — Other Ambulatory Visit: Payer: Self-pay | Admitting: Family Medicine

## 2018-05-10 ENCOUNTER — Telehealth: Payer: Self-pay | Admitting: Family Medicine

## 2018-05-10 ENCOUNTER — Encounter: Payer: Self-pay | Admitting: Family Medicine

## 2018-05-10 DIAGNOSIS — E118 Type 2 diabetes mellitus with unspecified complications: Secondary | ICD-10-CM | POA: Insufficient documentation

## 2018-05-10 DIAGNOSIS — Z794 Long term (current) use of insulin: Principal | ICD-10-CM

## 2018-05-10 DIAGNOSIS — E1165 Type 2 diabetes mellitus with hyperglycemia: Secondary | ICD-10-CM | POA: Insufficient documentation

## 2018-05-10 DIAGNOSIS — E119 Type 2 diabetes mellitus without complications: Secondary | ICD-10-CM | POA: Insufficient documentation

## 2018-05-10 MED ORDER — POTASSIUM CHLORIDE CRYS ER 20 MEQ PO TBCR: 20.0000 meq | EXTENDED_RELEASE_TABLET | Freq: Every day | ORAL | 3 refills | Status: DC

## 2018-05-10 MED ORDER — INSULIN PEN NEEDLE 31G X 4 MM MISC: 1.0000 [IU] | 1 refills | Status: DC | PRN

## 2018-05-10 NOTE — Telephone Encounter (Signed)
Prescription corrected and resent to [facility].

## 2018-05-10 NOTE — Telephone Encounter (Signed)
Office notes faxed to [facility] supply @ [phone]

## 2018-05-10 NOTE — Telephone Encounter (Signed)
Answered and resent rx on 05/09/18.

## 2018-05-10 NOTE — Telephone Encounter (Signed)
instructions read Take 1 tablet (20 mEq total) by mouth once for 1 dose. - Oral Please clarify.

## 2018-05-10 NOTE — Telephone Encounter (Signed)
Copied from CRM [id number]. Topic: Quick Communication - See Telephone Encounter >> May 10, 2018  9:52 AM Windy Kalata, NT wrote: CRM for notification. See Telephone encounter for: 05/10/18.  [facility] is calling and needs clarification on the directions to take potassium chloride SA (K-DUR,KLOR-CON) 20 MEQ tablet. She states it says "take 1 tablet by mouth once for 1 dose". Please advise.   Cb# [phone] Reference # [id number]

## 2018-05-10 NOTE — Telephone Encounter (Signed)
Noted  

## 2018-05-10 NOTE — Telephone Encounter (Signed)
Please fax OV notes from 05/05/18 to [facility] company.

## 2018-05-15 ENCOUNTER — Other Ambulatory Visit: Payer: Self-pay | Admitting: *Deleted

## 2018-05-15 ENCOUNTER — Telehealth: Payer: Self-pay | Admitting: Family Medicine

## 2018-05-15 MED ORDER — INSULIN PEN NEEDLE 31G X 5 MM MISC: 1.0000 [IU] | 1 refills | Status: DC | PRN

## 2018-05-15 NOTE — Telephone Encounter (Signed)
Patient requested different needle size- new Rx sent to pharmacy.

## 2018-05-15 NOTE — Telephone Encounter (Signed)
Copied from CRM [id number]. Topic: Quick Communication - Rx Refill/Question >> May 15, 2018 10:17 AM Gean BirchwoodWilliams-Neal, Sade R wrote: Medication: Insulin Pen Needle 31G X 5 MM MISC  Has the patient contacted their pharmacy? Yes , pt is requesting another size  Preferred Pharmacy (with phone number or street name): [facility] [place] [id number] AT [address] [facility] [phone] (Phone) [phone] (Fax)

## 2018-05-16 ENCOUNTER — Telehealth: Payer: Self-pay | Admitting: Family Medicine

## 2018-05-16 NOTE — Telephone Encounter (Signed)
Copied from CRM [phone]. Topic: General - Other >> May 16, 2018 10:37 AM Leafy Roobinson, Norma J wrote: Reason for CRM: [facility]. The pt is requesting bd mini needles pens  31 g x 5 mm instead of 31g x 4 mm. Reference number is [id number]

## 2018-05-17 NOTE — Telephone Encounter (Signed)
Sent in on 05/15/18 #100/1 refill.

## 2018-05-24 ENCOUNTER — Encounter: Payer: Self-pay | Admitting: Family Medicine

## 2018-06-29 ENCOUNTER — Encounter: Payer: Self-pay | Admitting: Internal Medicine

## 2018-07-03 ENCOUNTER — Ambulatory Visit (INDEPENDENT_AMBULATORY_CARE_PROVIDER_SITE_OTHER): Payer: Medicare (Managed Care) | Admitting: Internal Medicine

## 2018-07-03 ENCOUNTER — Encounter: Payer: Self-pay | Admitting: Internal Medicine

## 2018-07-03 VITALS — BP 118/68 | HR 87 | Resp 16 | Ht 69.0 in | Wt 280.0 lb

## 2018-07-03 DIAGNOSIS — G4733 Obstructive sleep apnea (adult) (pediatric): Secondary | ICD-10-CM

## 2018-07-03 DIAGNOSIS — R0609 Other forms of dyspnea: Secondary | ICD-10-CM | POA: Diagnosis not present

## 2018-07-03 NOTE — Progress Notes (Signed)
[facility] Pulmonary Medicine Consultation      Assessment and Plan:  Obstructive sleep apnea with sleep-related hypoxemia. - Currently on BiPAP at 18/9 but intolerant, feels that he does not get enough air. Continues to have elevated AHI with this has BiPAP machine malfunction, card can not be downloaded, and does not appear to blow enough air. --Continued symptoms of daytime sleepiness.  -Was unable to obtain a download from his card today. - We we will start patient on new machine, auto CPAP as per sleep study recommendations.   Dyspnea on exertion. -Multifactorial due to obesity, deconditioning/debility, as well as possible COPD.   - Will check baseline chest x-ray, before next visit. - Recommend weight loss, increase physical activity.   Orders Placed This Encounter  Procedures  . DG Chest 2 View  . Ambulatory Referral for DME    Return in about 3 months (around 10/03/2018).   Date: 07/03/2018  MRN# [id number] Cole Harris [birth year]    Cole Harris is a 72 y.o. old male seen in consultation for chief complaint of:    Chief Complaint  Patient presents with  . Sleep Apnea    pt is having trouble with cpap. He feels like he is not getting enough air.    HPI:  Patient is a 72 year old male with previously been on CPAP, and moved to [place] from [place].  In order to restart him on CPAP he was sent for new sleep study which confirmed the presence of severe obstructive sleep apnea.  He was started on CPAP with pressure range 13-20.  He was not started on the CPAP, and is instead on the bipap, he continues to feel that he does not get enough air. He is now using a full face mask.  He continues to feel sleepy much of the day, and lays down frequently to sleep during the day.    **Download dated 05/31/2018-06/29/2018>> raw data personally reviewed.  Usage greater than 4 hours is 19/30 days.  Average usage on days used 5 hours 23 minutes mode is 18/9.   Residual AHI is 63.7, 10 of which are central.  Overall this test shows borderline compliance with poor control of obstructive and central sleep apnea. **Split-night study with CPAP titration 01/17/2018>> severe obstructive sleep apnea with AHI 40, recommended auto CPAP with pressure range 13-20.  Medication:    Current Outpatient Medications:  .  atorvastatin (LIPITOR) 20 MG tablet, Take 1 tablet (20 mg total) by mouth daily., Disp: 90 tablet, Rfl: 3 .  buPROPion (WELLBUTRIN XL) 150 MG 24 hr tablet, Take 1 tablet (150 mg total) by mouth daily., Disp: 90 tablet, Rfl: 3 .  Cholecalciferol (VITAMIN D3) 50000 units TABS, Take 50,000 Units by mouth once a week., Disp: , Rfl:  .  Continuous Blood Gluc Receiver (FREESTYLE LIBRE 14 DAY READER) DEVI, 1 each by Does not apply route as directed. Check blood sugar daily as directed., Disp: 1 Device, Rfl: 0 .  Continuous Blood Gluc Sensor (FREESTYLE LIBRE 14 DAY SENSOR) MISC, 1 Units by Does not apply route every 14 (fourteen) days., Disp: 6 each, Rfl: 3 .  escitalopram (LEXAPRO) 20 MG tablet, Take 1 tablet (20 mg total) by mouth daily., Disp: 90 tablet, Rfl: 3 .  fenofibrate (TRICOR) 145 MG tablet, Take 1 tablet (145 mg total) by mouth daily., Disp: 90 tablet, Rfl: 3 .  furosemide (LASIX) 40 MG tablet, Take 1 tablet (40 mg total) by mouth daily., Disp: 90 tablet, Rfl:  3 .  Insulin Glargine (LANTUS) 100 UNIT/ML Solostar Pen, Inject 20 Units into the skin daily., Disp: 15 mL, Rfl: 11 .  Insulin Pen Needle 31G X 4 MM MISC, 1 Units by Does not apply route as needed., Disp: 100 each, Rfl: 1 .  Insulin Pen Needle 31G X 5 MM MISC, 1 Units by Does not apply route as needed., Disp: 100 each, Rfl: 1 .  irbesartan (AVAPRO) 300 MG tablet, Take 1 tablet (300 mg total) by mouth daily., Disp: 90 tablet, Rfl: 3 .  meclizine (ANTIVERT) 25 MG tablet, Take 1 tablet (25 mg total) by mouth 3 (three) times daily as needed for dizziness or nausea., Disp: 30 tablet, Rfl: 1 .   Memantine HCl-Donepezil HCl (NAMZARIC) 28-10 MG CP24, Take 1 capsule by mouth daily., Disp: , Rfl:  .  Misc. Devices (ADJUSTABLE ALUMINUM CANE 3/4") MISC, 1 Units by Does not apply route as needed., Disp: 1 each, Rfl: 0 .  niacin (NIASPAN) 500 MG CR tablet, Take 1 tablet (500 mg total) by mouth at bedtime., Disp: 90 tablet, Rfl: 3 .  omeprazole (PRILOSEC) 20 MG capsule, Take 1 capsule (20 mg total) by mouth daily., Disp: 90 capsule, Rfl: 3 .  potassium chloride SA (K-DUR,KLOR-CON) 20 MEQ tablet, Take 1 tablet (20 mEq total) by mouth daily., Disp: 90 tablet, Rfl: 3 .  sitaGLIPtin-metformin (JANUMET) 50-1000 MG tablet, Take 1 tablet by mouth 2 (two) times daily with a meal., Disp: 90 tablet, Rfl: 3   Allergies:  Patient has no known allergies.  Review of Systems:  Constitutional: Feels well. Cardiovascular: Denies chest pain, exertional chest pain.  Pulmonary: Denies hemoptysis, pleuritic chest pain.   The remainder of systems were reviewed and were found to be negative other than what is documented in the HPI.    Physical Examination:   VS: BP 118/68 (BP Location: Left Arm, Cuff Size: Large)   Pulse 87   Resp 16   Ht 5\' 9"  (1.753 m)   Wt 280 lb (127 kg)   SpO2 94%   BMI 41.35 kg/m   General Appearance: No distress  Neuro:without focal findings, mental status, speech normal, alert and oriented HEENT: PERRLA, EOM intact Pulmonary: No wheezing, No rales  CardiovascularNormal S1,S2.  No m/r/g.  Abdomen: Benign, Soft, non-tender, No masses Renal:  No costovertebral tenderness  GU:  No performed at this time. Endoc: No evident thyromegaly, no signs of acromegaly or Cushing features Skin:   warm, no rashes, no ecchymosis  Extremities: normal, no cyanosis, clubbing.      LABORATORY PANEL:   CBC No results for input(s): WBC, HGB, HCT, PLT in the last 168  hours. ------------------------------------------------------------------------------------------------------------------  Chemistries  No results for input(s): NA, K, CL, CO2, GLUCOSE, BUN, CREATININE, CALCIUM, MG, AST, ALT, ALKPHOS, BILITOT in the last 168 hours.  Invalid input(s): GFRCGP ------------------------------------------------------------------------------------------------------------------  Cardiac Enzymes No results for input(s): TROPONINI in the last 168 hours. ------------------------------------------------------------  RADIOLOGY:  No results found.     Thank  you for the consultation and for allowing [facility] to assist in the care of your patient. Our recommendations are noted above.  Please contact us if we can be of further service.  Wells Guiles, M.D., F.C.C.P.  Board Certified in Internal Medicine, Pulmonary Medicine, Critical Care Medicine, and Sleep Medicine.  [facility] Office Number: [phone]  07/03/2018

## 2018-07-03 NOTE — Patient Instructions (Signed)
Will change from Bipap to AutoCPAP. Try to use every night.

## 2018-08-16 ENCOUNTER — Ambulatory Visit: Payer: Medicare Other

## 2018-08-18 ENCOUNTER — Encounter: Payer: PRIVATE HEALTH INSURANCE | Admitting: Family Medicine

## 2018-09-14 ENCOUNTER — Telehealth: Payer: Self-pay | Admitting: Internal Medicine

## 2018-09-15 NOTE — Telephone Encounter (Signed)
Left detailed message letting Clydie BraunKaren know her message has been received. A message was left for [facility] to let us know status of bi-pap being switched to cpap. The order was sent in Nov 19. Pending feedback from [facility].

## 2018-09-15 NOTE — Telephone Encounter (Signed)
Called and spoke with Tiffany at [place].  Order was sent to [facility] and they responded back on 07/04/18 that order has been received. Tiffany will pull order and give to RT to address.  Pt relocated from [facility] to live with daughter here in [place].  We didn't have the sleep study from [facility], so pt had another Sleep Study which indicated that he no longer needed the BiPap and that CPAP controled OSA.  Order was placed for CPAP and as of now, pt or daughter has not heard about this order.  Requested that Buford Dresser from [facility] return my call. Rhonda J Cobb

## 2018-09-15 NOTE — Telephone Encounter (Signed)
Spoke with Marchelle Folks with [facility]. Order was received by [facility] on 07/04/18.  [facility] was not aware that order was to change pt from BiPap to CPAP, [facility] thought order was for supplies per [facility].  [facility] faxed over CMN and physician has signed and this has been received by [facility] to process CPAP. Called and spoke with patient's daughter and advised her of above and to contact me if she doesn't hear from [facility] to arrange CPAP set up. Nothing else needed at this time. Rhonda J Cobb

## 2018-09-22 ENCOUNTER — Ambulatory Visit (INDEPENDENT_AMBULATORY_CARE_PROVIDER_SITE_OTHER): Payer: Medicare (Managed Care)

## 2018-09-22 ENCOUNTER — Other Ambulatory Visit: Payer: Self-pay

## 2018-09-22 VITALS — BP 118/68 | HR 76 | Temp 97.5°F | Ht 68.5 in | Wt 273.2 lb

## 2018-09-22 DIAGNOSIS — Z0000 Encounter for general adult medical examination without abnormal findings: Secondary | ICD-10-CM

## 2018-09-22 DIAGNOSIS — E538 Deficiency of other specified B group vitamins: Secondary | ICD-10-CM | POA: Diagnosis not present

## 2018-09-22 DIAGNOSIS — Z794 Long term (current) use of insulin: Secondary | ICD-10-CM | POA: Diagnosis not present

## 2018-09-22 DIAGNOSIS — E785 Hyperlipidemia, unspecified: Secondary | ICD-10-CM

## 2018-09-22 DIAGNOSIS — E1165 Type 2 diabetes mellitus with hyperglycemia: Secondary | ICD-10-CM

## 2018-09-22 LAB — LIPID PANEL
CHOL/HDL RATIO: 4
CHOLESTEROL: 166 mg/dL (ref 0–200)
HDL: 39.5 mg/dL (ref >39.00)
LDL CALC: 93 mg/dL (ref 0–99)
NonHDL: 126.87
TRIGLYCERIDES: 169 mg/dL — AB (ref 0.0–149.0)
VLDL: 33.8 mg/dL (ref 0.0–40.0)

## 2018-09-22 LAB — COMPREHENSIVE METABOLIC PANEL
ALT: 18 U/L (ref 0–53)
AST: 18 U/L (ref 0–37)
Albumin: 4.4 g/dL (ref 3.5–5.2)
Alkaline Phosphatase: 24 U/L — ABNORMAL LOW (ref 39–117)
BUN: 20 mg/dL (ref 6–23)
CHLORIDE: 103 meq/L (ref 96–112)
CO2: 27 meq/L (ref 19–32)
Calcium: 10.3 mg/dL (ref 8.4–10.5)
Creatinine, Ser: 1.09 mg/dL (ref 0.40–1.50)
GFR: 66.63 mL/min (ref >60.00)
Glucose, Bld: 100 mg/dL — ABNORMAL HIGH (ref 70–99)
POTASSIUM: 4.7 meq/L (ref 3.5–5.1)
SODIUM: 140 meq/L (ref 135–145)
TOTAL PROTEIN: 7.2 g/dL (ref 6.0–8.3)
Total Bilirubin: 0.5 mg/dL (ref 0.2–1.2)

## 2018-09-22 LAB — VITAMIN B12: Vitamin B-12: 450 pg/mL (ref 211–911)

## 2018-09-22 LAB — CBC WITH DIFFERENTIAL/PLATELET
BASOS ABS: 0.1 10*3/uL (ref 0.0–0.1)
BASOS PCT: 1 % (ref 0.0–3.0)
EOS ABS: 0.3 10*3/uL (ref 0.0–0.7)
Eosinophils Relative: 3.3 % (ref 0.0–5.0)
HEMATOCRIT: 44.1 % (ref 39.0–52.0)
Hemoglobin: 14.9 g/dL (ref 13.0–17.0)
LYMPHS ABS: 3.1 10*3/uL (ref 0.7–4.0)
LYMPHS PCT: 32.5 % (ref 12.0–46.0)
MCHC: 33.8 g/dL (ref 30.0–36.0)
MCV: 97.7 fl (ref 78.0–100.0)
Monocytes Absolute: 0.7 10*3/uL (ref 0.1–1.0)
Monocytes Relative: 7 % (ref 3.0–12.0)
NEUTROS ABS: 5.4 10*3/uL (ref 1.4–7.7)
NEUTROS PCT: 56.2 % (ref 43.0–77.0)
PLATELETS: 272 10*3/uL (ref 150.0–400.0)
RBC: 4.51 Mil/uL (ref 4.22–5.81)
RDW: 13.2 % (ref 11.5–15.5)
WBC: 9.6 10*3/uL (ref 4.0–10.5)

## 2018-09-22 LAB — HEMOGLOBIN A1C: HEMOGLOBIN A1C: 6.3 % (ref 4.6–6.5)

## 2018-09-22 NOTE — Telephone Encounter (Signed)
Opened in error

## 2018-09-22 NOTE — Progress Notes (Signed)
Subjective:   Cole Harris is a 73 y.o. male who presents for Medicare Annual/Subsequent preventive examination.  Review of Systems:  N/A Cardiac Risk Factors include: advanced age (>4855men, 69>65 women);diabetes mellitus;dyslipidemia;hypertension;male gender;obesity (BMI >30kg/m2)     Objective:    Vitals: BP 118/68 (BP Location: Right Arm, Patient Position: Sitting, Cuff Size: Large)   Pulse 76   Temp (!) 97.5 F (36.4 C) (Oral)   Ht 5' 8.5" (1.74 m) Comment: no shoes  Wt 273 lb 4 oz (123.9 kg)   SpO2 96%   BMI 40.94 kg/m   Body mass index is 40.94 kg/m.  Advanced Directives 09/22/2018 08/18/2017 08/12/2017  Does Patient Have a Medical Advance Directive? No No No  Would patient like information on creating a medical advance directive? No - Patient declined No - Patient declined Yes (MAU/Ambulatory/Procedural Areas - Information given)    Tobacco Social History   Tobacco Use  Smoking Status Former Smoker  Smokeless Tobacco Never Used     Counseling given: No   Clinical Intake:  Pre-visit preparation completed: Yes  Pain : No/denies pain Pain Score: 0-No pain     Nutritional Status: BMI > 30  Obese Nutritional Risks: None Diabetes: Yes CBG done?: No Did pt. bring in CBG monitor from home?: No  How often do you need to have someone help you when you read instructions, pamphlets, or other written materials from your doctor or pharmacy?: 2 - Rarely  Interpreter Needed?: No  Comments: pt lives with spouse Information entered by :: LPinson, LPN  Past Medical History:  Diagnosis Date  . Alzheimer's disease with early onset (HCC) 08/24/2017  . Arthritis   . CAD (coronary artery disease) 08/24/2017  . Depression   . Diabetes mellitus without complication (HCC)   . GERD (gastroesophageal reflux disease)   . Hyperlipidemia   . Hypertension   . Pulmonary hypertension (HCC) 08/24/2017  . Vertebrobasilar insufficiency 08/24/2017   Past Surgical History:    Procedure Laterality Date  . BACK SURGERY  1998  . INTERNAL TENS UNIT  2002  . TONSILLECTOMY  1955   Family History  Problem Relation Age of Onset  . Arthritis Mother   . Arthritis Father   . Hearing loss Father   . Heart disease Father   . Hyperlipidemia Father    Social History   Socioeconomic History  . Marital status: Married    Spouse name: Not on file  . Number of children: Not on file  . Years of education: Not on file  . Highest education level: Not on file  Occupational History  . Not on file  Social Needs  . Financial resource strain: Not on file  . Food insecurity:    Worry: Not on file    Inability: Not on file  . Transportation needs:    Medical: Not on file    Non-medical: Not on file  Tobacco Use  . Smoking status: Former Games developermoker  . Smokeless tobacco: Never Used  Substance and Sexual Activity  . Alcohol use: No    Frequency: Never  . Drug use: No  . Sexual activity: Never    Birth control/protection: Cervical cap  Lifestyle  . Physical activity:    Days per week: Not on file    Minutes per session: Not on file  . Stress: Not on file  Relationships  . Social connections:    Talks on phone: Not on file    Gets together: Not on file  Attends religious service: Not on file    Active member of club or organization: Not on file    Attends meetings of clubs or organizations: Not on file    Relationship status: Not on file  Other Topics Concern  . Not on file  Social History Narrative  . Not on file    Outpatient Encounter Medications as of 09/22/2018  Medication Sig  . atorvastatin (LIPITOR) 20 MG tablet Take 1 tablet (20 mg total) by mouth daily.  Marland Kitchen buPROPion (WELLBUTRIN XL) 150 MG 24 hr tablet Take 1 tablet (150 mg total) by mouth daily.  . Cholecalciferol (VITAMIN D3) 50000 units TABS Take 50,000 Units by mouth once a week.  . Continuous Blood Gluc Receiver (FREESTYLE LIBRE 14 DAY READER) DEVI 1 each by Does not apply route as directed.  Check blood sugar daily as directed.  . Continuous Blood Gluc Sensor (FREESTYLE LIBRE 14 DAY SENSOR) MISC 1 Units by Does not apply route every 14 (fourteen) days.  Marland Kitchen escitalopram (LEXAPRO) 20 MG tablet Take 1 tablet (20 mg total) by mouth daily.  . fenofibrate (TRICOR) 145 MG tablet Take 1 tablet (145 mg total) by mouth daily.  . furosemide (LASIX) 40 MG tablet Take 1 tablet (40 mg total) by mouth daily.  . Insulin Pen Needle 31G X 4 MM MISC 1 Units by Does not apply route as needed.  . Insulin Pen Needle 31G X 5 MM MISC 1 Units by Does not apply route as needed.  . irbesartan (AVAPRO) 300 MG tablet Take 1 tablet (300 mg total) by mouth daily.  . meclizine (ANTIVERT) 25 MG tablet Take 1 tablet (25 mg total) by mouth 3 (three) times daily as needed for dizziness or nausea.  . Memantine HCl-Donepezil HCl (NAMZARIC) 28-10 MG CP24 Take 1 capsule by mouth daily.  . Misc. Devices (ADJUSTABLE ALUMINUM CANE 3/4") MISC 1 Units by Does not apply route as needed.  . niacin (NIASPAN) 500 MG CR tablet Take 1 tablet (500 mg total) by mouth at bedtime.  Marland Kitchen omeprazole (PRILOSEC) 20 MG capsule Take 1 capsule (20 mg total) by mouth daily.  . potassium chloride SA (K-DUR,KLOR-CON) 20 MEQ tablet Take 1 tablet (20 mEq total) by mouth daily.  . sitaGLIPtin-metformin (JANUMET) 50-1000 MG tablet Take 1 tablet by mouth 2 (two) times daily with a meal.  . vitamin B-12 (CYANOCOBALAMIN) 1000 MCG tablet Take by mouth.  . Insulin Glargine (LANTUS) 100 UNIT/ML Solostar Pen Inject 20 Units into the skin daily. (Patient not taking: Reported on 09/22/2018)   No facility-administered encounter medications on file as of 09/22/2018.     Activities of Daily Living In your present state of health, do you have any difficulty performing the following activities: 09/22/2018  Hearing? Y  Vision? N  Difficulty concentrating or making decisions? Y  Walking or climbing stairs? N  Dressing or bathing? N  Doing errands, shopping? Y    Preparing Food and eating ? N  Using the Toilet? N  In the past six months, have you accidently leaked urine? Y  Do you have problems with loss of bowel control? N  Managing your Medications? Y  Managing your Finances? Y  Housekeeping or managing your Housekeeping? Y  Some recent data might be hidden    Patient Care Team: Emi Belfast, FNP as PCP - General (Nurse Practitioner)   Assessment:   This is a routine wellness examination for [place].  Exercise Activities and Dietary recommendations Current Exercise Habits: The patient does  not participate in regular exercise at present, Exercise limited by: None identified  Goals    . DIET - INCREASE WATER INTAKE     Starting 09/22/2018, I will continue to drink at least 8-10 glasses of water daily.        Fall Risk Fall Risk  09/22/2018 08/12/2017  Falls in the past year? 1 No  Number falls in past yr: 0 -  Injury with Fall? 1 -  Risk for fall due to : Impaired balance/gait;Impaired mobility -   Depression Screen PHQ 2/9 Scores 09/22/2018 08/12/2017  PHQ - 2 Score 0 0  PHQ- 9 Score 0 1    Cognitive Function MMSE - Mini Mental State Exam 09/22/2018 08/12/2017  Orientation to time 5 5  Orientation to Place 5 5  Registration 3 3  Attention/ Calculation 0 0  Recall 0 0  Recall-comments unable to recall 3 of 3 words unable to recall 3 of 3 words  Language- name 2 objects 0 0  Language- repeat 1 1  Language- follow 3 step command 3 3  Language- follow 3 step command-comments unable to follow 1 step of 3 step command -  Language- read & follow direction 0 0  Write a sentence 0 0  Copy design 0 0  Total score 17 17       PLEASE NOTE: A Mini-Cog screen was completed. Maximum score is 20. A value of 0 denotes this part of Folstein MMSE was not completed or the patient failed this part of the Mini-Cog screening.   Mini-Cog Screening Orientation to Time - Max 5 pts Orientation to Place - Max 5 pts Registration - Max 3  pts Recall - Max 3 pts Language Repeat - Max 1 pts Language Follow 3 Step Command - Max 3 pts   Immunization History  Administered Date(s) Administered  . Influenza, High Dose Seasonal PF 05/05/2018    Screening Tests Health Maintenance  Topic Date Due  . FOOT EXAM  10/04/2018 (Originally 06/02/1956)  . OPHTHALMOLOGY EXAM  12/26/2018 (Originally 11/28/2017)  . PNA vac Low Risk Adult (1 of 2 - PCV13) 08/30/2019 (Originally 06/02/2012)  . TETANUS/TDAP  08/29/2020 (Originally 06/02/1966)  . HEMOGLOBIN A1C  11/03/2018  . COLONOSCOPY  08/30/2024  . INFLUENZA VACCINE  Completed  . Hepatitis C Screening  Completed        Plan:     I have personally reviewed, addressed, and noted the following in the patient's chart:  A. Medical and social history B. Use of alcohol, tobacco or illicit drugs  C. Current medications and supplements D. Functional ability and status E.  Nutritional status F.  Physical activity G. Advance directives H. List of other physicians I.  Hospitalizations, surgeries, and ER visits in previous 12 months J.  Vitals K. Screenings to include hearing, vision, cognitive, depression L. Referrals and appointments - none  In addition, I have reviewed and discussed with patient certain preventive protocols, quality metrics, and best practice recommendations. A written personalized care plan for preventive services as well as general preventive health recommendations were provided to patient.  See attached scanned questionnaire for additional information.   Signed,   Randa EvensLesia Susan Bleich, MHA, BS, LPN Health Coach

## 2018-09-22 NOTE — Patient Instructions (Signed)
[name] , Thank you for taking time to come for your Medicare Wellness Visit. I appreciate your ongoing commitment to your health goals. Please review the following plan we discussed and let me know if I can assist you in the future.   These are the goals we discussed: Goals    . DIET - INCREASE WATER INTAKE     Starting 09/22/2018, I will continue to drink at least 8-10 glasses of water daily.        This is a list of the screening recommended for you and due dates:  Health Maintenance  Topic Date Due  . Complete foot exam   10/04/2018*  . Eye exam for diabetics  12/26/2018*  . Pneumonia vaccines (1 of 2 - PCV13) 08/30/2019*  . Tetanus Vaccine  08/29/2020*  . Hemoglobin A1C  11/03/2018  . Colon Cancer Screening  08/30/2024  . Flu Shot  Completed  .  Hepatitis C: One time screening is recommended by Center for Disease Control  (CDC) for  adults born from [birth year] through 1964.   Completed  *Topic was postponed. The date shown is not the original due date.   Preventive Care for Adults  A healthy lifestyle and preventive care can promote health and wellness. Preventive health guidelines for adults include the following key practices.  . A routine yearly physical is a good way to check with your health care provider about your health and preventive screening. It is a chance to share any concerns and updates on your health and to receive a thorough exam.  . Visit your dentist for a routine exam and preventive care every 6 months. Brush your teeth twice a day and floss once a day. Good oral hygiene prevents tooth decay and gum disease.  . The frequency of eye exams is based on your age, health, family medical history, use  of contact lenses, and other factors. Follow your health care provider's recommendations for frequency of eye exams.  . Eat a healthy diet. Foods like vegetables, fruits, whole grains, low-fat dairy products, and lean protein foods contain the nutrients you need without  too many calories. Decrease your intake of foods high in solid fats, added sugars, and salt. Eat the right amount of calories for you. Get information about a proper diet from your health care provider, if necessary.  . Regular physical exercise is one of the most important things you can do for your health. Most adults should get at least 150 minutes of moderate-intensity exercise (any activity that increases your heart rate and causes you to sweat) each week. In addition, most adults need muscle-strengthening exercises on 2 or more days a week.  Silver Sneakers may be a benefit available to you. To determine eligibility, you may visit the website: www.silversneakers.com or contact program at 870-157-6260 Mon-Fri between 8AM-8PM.   . Maintain a healthy weight. The body mass index (BMI) is a screening tool to identify possible weight problems. It provides an estimate of body fat based on height and weight. Your health care provider can find your BMI and can help you achieve or maintain a healthy weight.   For adults 20 years and older: ? A BMI below 18.5 is considered underweight. ? A BMI of 18.5 to 24.9 is normal. ? A BMI of 25 to 29.9 is considered overweight. ? A BMI of 30 and above is considered obese.   . Maintain normal blood lipids and cholesterol levels by exercising and minimizing your intake of saturated fat.  Eat a balanced diet with plenty of fruit and vegetables. Blood tests for lipids and cholesterol should begin at age 17 and be repeated every 5 years. If your lipid or cholesterol levels are high, you are over 50, or you are at high risk for heart disease, you may need your cholesterol levels checked more frequently. Ongoing high lipid and cholesterol levels should be treated with medicines if diet and exercise are not working.  . If you smoke, find out from your health care provider how to quit. If you do not use tobacco, please do not start.  . If you choose to drink alcohol,  please do not consume more than 2 drinks per day. One drink is considered to be 12 ounces (355 mL) of beer, 5 ounces (148 mL) of wine, or 1.5 ounces (44 mL) of liquor.  . If you are 58-108 years old, ask your health care provider if you should take aspirin to prevent strokes.  . Use sunscreen. Apply sunscreen liberally and repeatedly throughout the day. You should seek shade when your shadow is shorter than you. Protect yourself by wearing long sleeves, pants, a wide-brimmed hat, and sunglasses year round, whenever you are outdoors.  . Once a month, do a whole body skin exam, using a mirror to look at the skin on your back. Tell your health care provider of new moles, moles that have irregular borders, moles that are larger than a pencil eraser, or moles that have changed in shape or color.

## 2018-09-22 NOTE — Progress Notes (Signed)
PCP notes:   Health maintenance:  PPSV23 - PCP follow-up requested Eye exam - addressed  Abnormal screenings:   Hearing - failed  Hearing Screening   125Hz  250Hz  500Hz  1000Hz  2000Hz  3000Hz  4000Hz  6000Hz  8000Hz   Right ear:   40 40 40  40    Left ear:   40 40 40  0     Mini-Cog score: 17/20 MMSE - Mini Mental State Exam 09/22/2018 08/12/2017  Orientation to time 5 5  Orientation to Place 5 5  Registration 3 3  Attention/ Calculation 0 0  Recall 0 0  Recall-comments unable to recall 3 of 3 words unable to recall 3 of 3 words  Language- name 2 objects 0 0  Language- repeat 1 1  Language- follow 3 step command 3 3  Language- follow 3 step command-comments unable to follow 1 step of 3 step command -  Language- read & follow direction 0 0  Write a sentence 0 0  Copy design 0 0  Total score 17 17    Fall risk - hx of single fall Fall Risk  09/22/2018 08/12/2017  Falls in the past year? 1 No  Number falls in past yr: 0 -  Injury with Fall? 1 -  Risk for fall due to : Impaired balance/gait;Impaired mobility -    Patient concerns:   Medication management  1) family member requested patient's medications be decreased due his sleepiness, fatigue, and dizziness 2) refill requested for pain medication 3) discontinued insulin due to BG within normal limit  Nurse concerns:  None  Next PCP appt:   10/04/18 @ 1200

## 2018-09-25 NOTE — Progress Notes (Signed)
I reviewed health advisor's note, was available for consultation, and agree with documentation and plan.  

## 2018-09-28 ENCOUNTER — Telehealth: Payer: Self-pay | Admitting: Internal Medicine

## 2018-09-29 NOTE — Telephone Encounter (Signed)
Spoke with [facility] this morning and patient has been scheduled for an appointment on 10/02/2018 for CPAP set up.  Called and spoke with pt's daughter, Clydie BraunKaren and she stated that she was [facility] yesterday [facility] and was made aware of appointment date, and [facility].  Nothing else needed at this time. Rhonda J Cobb

## 2018-10-04 ENCOUNTER — Ambulatory Visit (INDEPENDENT_AMBULATORY_CARE_PROVIDER_SITE_OTHER): Payer: Medicare (Managed Care) | Admitting: Family Medicine

## 2018-10-04 VITALS — BP 120/76 | HR 76 | Temp 97.7°F | Ht 68.5 in | Wt 278.0 lb

## 2018-10-04 DIAGNOSIS — F0280 Dementia in other diseases classified elsewhere without behavioral disturbance: Secondary | ICD-10-CM

## 2018-10-04 DIAGNOSIS — Z23 Encounter for immunization: Secondary | ICD-10-CM

## 2018-10-04 DIAGNOSIS — R2689 Other abnormalities of gait and mobility: Secondary | ICD-10-CM

## 2018-10-04 DIAGNOSIS — E1169 Type 2 diabetes mellitus with other specified complication: Secondary | ICD-10-CM

## 2018-10-04 DIAGNOSIS — R400 Somnolence: Secondary | ICD-10-CM

## 2018-10-04 DIAGNOSIS — G300 Alzheimer's disease with early onset: Secondary | ICD-10-CM | POA: Diagnosis not present

## 2018-10-04 NOTE — Progress Notes (Signed)
Subjective:    Patient ID: Cole Harris, male    DOB: 05-09-1946, 73 y.o.   MRN: [id number]  HPI This is a 73 yo male who presents today for follow up, accompanied by his wife and DIL, Clydie Braun. Had Medicare AWV 09/22/2018 .   Fatigue- Family notices some daytime sleepiness. Watches TV during the day. Sleeping well at night. No hobbies or interests that he can participate in. 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 No work shop at his current location. No projects to work on. Get out some with family for dining, outings. Little socializing. Not interested in senior center. Little regular exercise. DIL wondering if his medications are causing sedation.   Alzheimer's- Last seen by neurology 10/19, follow up scheduled for the spring. Currently on Memantine-donepezil 28/18. Suggested increased socialization, marching in place daily. Has not been doing this. Mini- coag 09/22/18 was 17/20, this is unchanged from 08/12/17.   CPAP- off BiPap due to pulling on mask, regular CPAP, followed by pulmonary  DM- off insulin, doing well with diet. Hgba1c 6.3 09/22/18. He is currently on Janumet 50-1000. Occasional foot burning at night, several times a month.   Chronic neck pain. Has occasional need for pain med. Clydie Braun will send me a picture. Requires a couple of times a year. Has not recently needed medication but family likes to have on hand for severe episodes of pain.   Balance/light headedness- occasionally feels off balance, had a recent fall in his closet. Doesn't remember what happened and wasn't witnessed. Thinks he bumped into a shelf. Not sure if he fell to the floor. Was not found on floor. Denies feeling lightheadedness with position change from laying to sitting or sitting to standing. He says he feels a little stiff and wobbly when standing after sitting for a long time and has to stand for a few seconds.   He denies chest pain, has some SOB with exertion, no LE edema, no abdominal  pain/diarrhea/constipation, no dysuria, frequency or nocturia. + bilateral shoulder pain (chronic).  Past Medical History:  Diagnosis Date  . Alzheimer's disease with early onset (HCC) 08/24/2017  . Arthritis   . CAD (coronary artery disease) 08/24/2017  . Depression   . Diabetes mellitus without complication (HCC)   . GERD (gastroesophageal reflux disease)   . Hyperlipidemia   . Hypertension   . Pulmonary hypertension (HCC) 08/24/2017  . Vertebrobasilar insufficiency 08/24/2017   Past Surgical History:  Procedure Laterality Date  . BACK SURGERY  1998  . INTERNAL TENS UNIT  2002  . TONSILLECTOMY  1955   Family History  Problem Relation Age of Onset  . Arthritis Mother   . Arthritis Father   . Hearing loss Father   . Heart disease Father   . Hyperlipidemia Father    Social History   Tobacco Use  . Smoking status: Former Games developer  . Smokeless tobacco: Never Used  Substance Use Topics  . Alcohol use: No    Frequency: Never  . Drug use: No      Review of Systems Per HPI    Objective:   Physical Exam Vitals signs reviewed.  Constitutional:      General: He is not in acute distress.    Appearance: Normal appearance. He is obese. He is not ill-appearing, toxic-appearing or diaphoretic.  HENT:     Head: Normocephalic and atraumatic.  Eyes:     Conjunctiva/sclera: Conjunctivae normal.  Cardiovascular:     Rate and  Rhythm: Normal rate and regular rhythm.     Heart sounds: Normal heart sounds.  Pulmonary:     Effort: Pulmonary effort is normal.     Breath sounds: Normal breath sounds.  Musculoskeletal:     Right lower leg: No edema.     Left lower leg: No edema.     Comments: Steady, slightly wide based gait.   Skin:    General: Skin is warm and dry.  Neurological:     Mental Status: He is alert. Mental status is at baseline.     Comments: Appropriately interactive. Answers questions, engages in conversation. Family members contradict some of his statements.     Psychiatric:        Mood and Affect: Mood normal.        Behavior: Behavior normal.        Thought Content: Thought content normal.        Judgment: Judgment normal.       BP 120/76   Pulse 76   Temp 97.7 F (36.5 C) (Oral)   Wt 278 lb (126.1 kg)   SpO2 97%   BMI 41.65 kg/m  Wt Readings from Last 3 Encounters:  10/04/18 278 lb (126.1 kg)  09/22/18 273 lb 4 oz (123.9 kg)  07/03/18 280 lb (127 kg)   BP Readings from Last 3 Encounters:  10/04/18 120/76  09/22/18 118/68  07/03/18 118/68   Diabetic foot exam: Normal inspection No skin breakdown Few areas callus and flaky skin Normal DP pulses Normal sensation to light touch and monofilament Nails thickened     Assessment & Plan:  1. Type 2 diabetes mellitus with other specified complication, without long-term current use of insulin (HCC) - doing great with improved diet since moving in with son and daughter in law.  - will decrease Janumet to qd until current supply used, his DIL will send me readings and if continue to be good will try to change to metformin only.   2. Need for 23-polyvalent pneumococcal polysaccharide vaccine - Pneumococcal polysaccharide vaccine 23-valent greater than or equal to 2yo subcutaneous/IM  3. Daytime somnolence - likely multi-factorial-OSA (pulmonary working on this currently), lack of daytime stimulation and activity -Reviewed his medication list and do not see obvious culprits here -Encouraged him to do daily marching as recommended by neurology, outings as able, explore any interest in hobbies or activities he can do at home  4. Early onset Alzheimer's dementia without behavioral disturbance (HCC) -Continue current meds and follow-up with neurology  5. Poor balance -His daughter-in-law will alert me if this seems to be getting worse or he has further falls, can consider physical therapy -Discussed benefit of daily marching and walking as much as possible as well as chair stretching  to help with this  -Follow-up in 6 months   Olean Ree, FNP-BC  [facility] at [facility], [facility]  10/05/2018 7:04 PM

## 2018-10-04 NOTE — Patient Instructions (Addendum)
Decrease Janumet to once a day.  Let me know how blood sugars are doing- goal less than 175.   Follow up in 6 months  Increase activity as tolerated, regular walking, hobbies?   March in place for 15 minutes daily- can break up into smaller intervals as needed

## 2018-10-05 ENCOUNTER — Encounter: Payer: Self-pay | Admitting: Family Medicine

## 2018-10-15 IMAGING — CT CT HEAD W/O CM
3 series · 15 of 47 positions shown, 18 images · non-contrast
Comparison: None.

CLINICAL DATA: 71-year-old male with dizziness, headache and
vomiting for 1 day.

EXAM:
CT HEAD WITHOUT CONTRAST
TECHNIQUE: Contiguous axial images were obtained from the base of the skull
through the vertex without intravenous contrast.

[Series 2: head wo · axial · 0.43mm/px · z∈[-116,+9]mm · 9 of 31 slices shown, 12 images]
[im 3/31  brain]
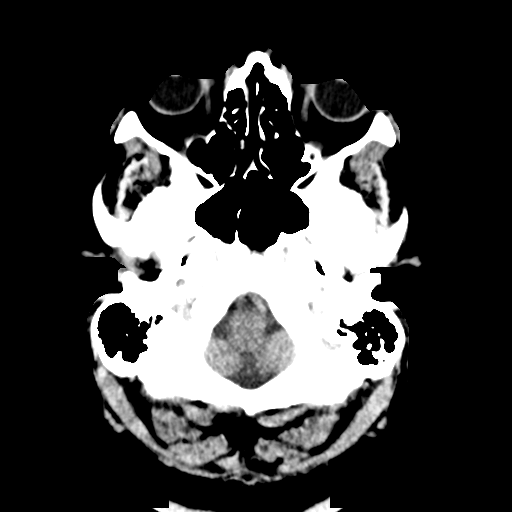
[im 3/31  bone]
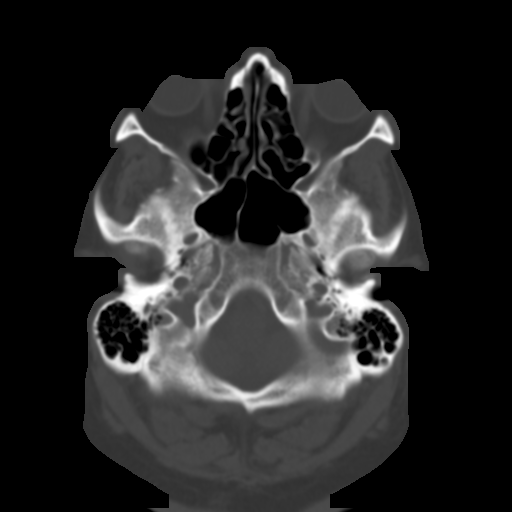
[im 6/31  brain]
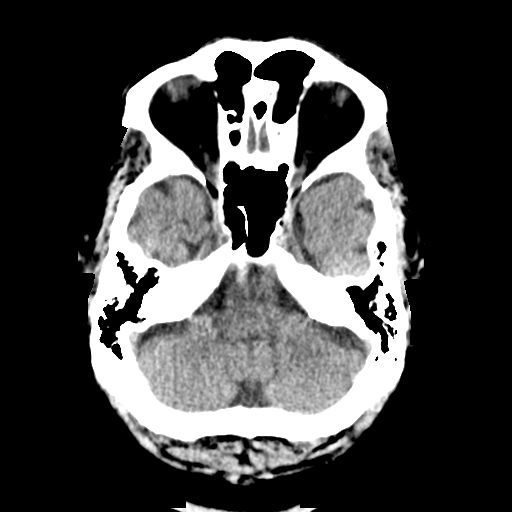
[im 9/31  brain]
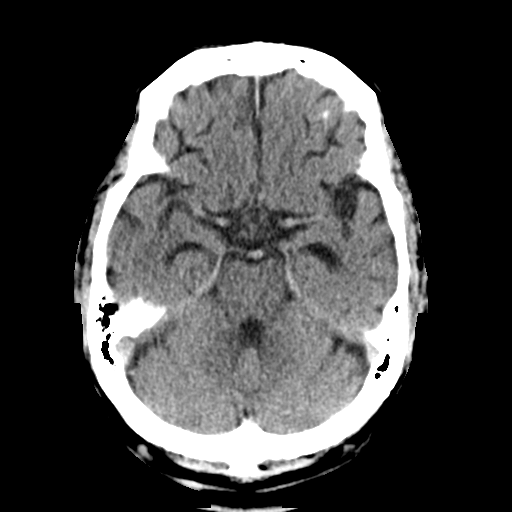
[im 12/31  brain]
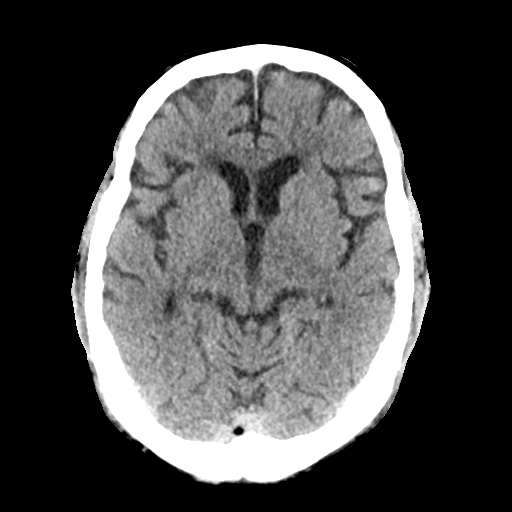
[im 16/31  brain]
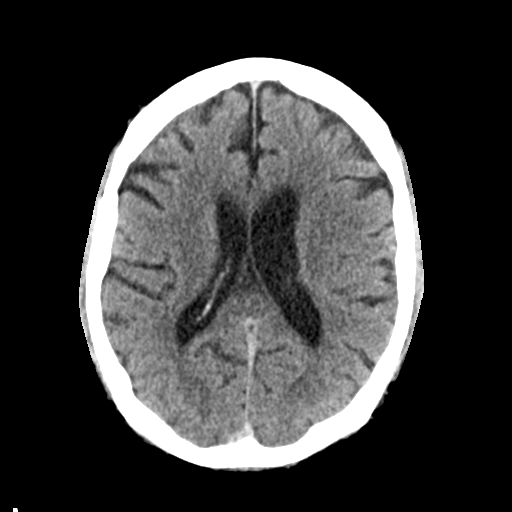
[im 16/31  bone]
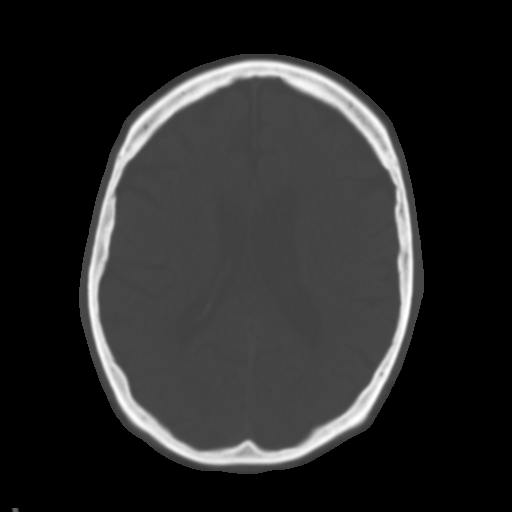
[im 19/31  brain]
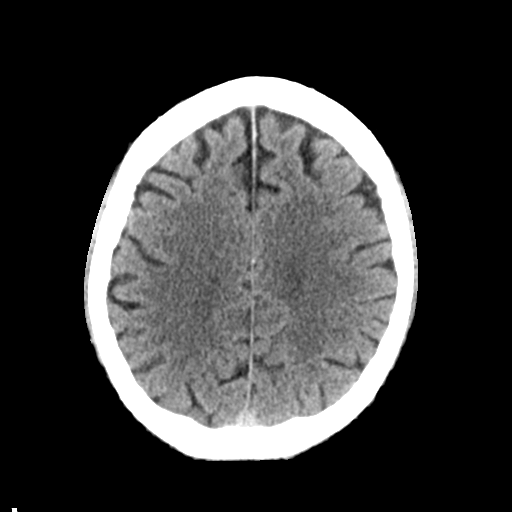
[im 22/31  brain]
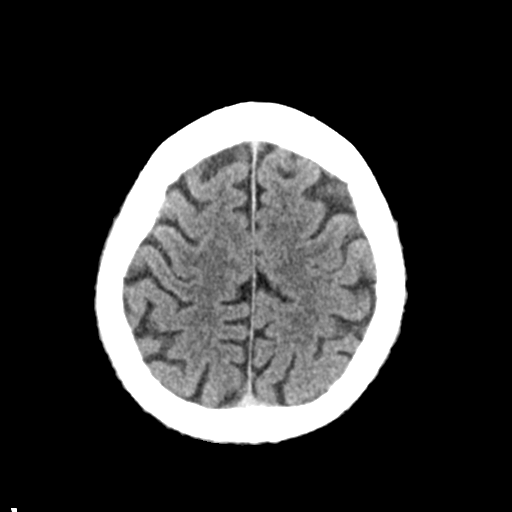
[im 25/31  brain]
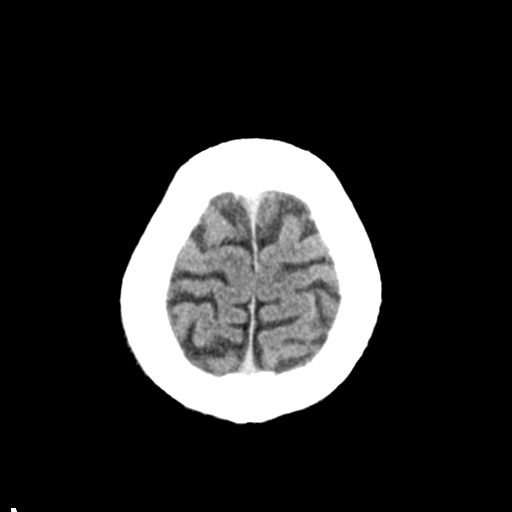
[im 28/31  brain]
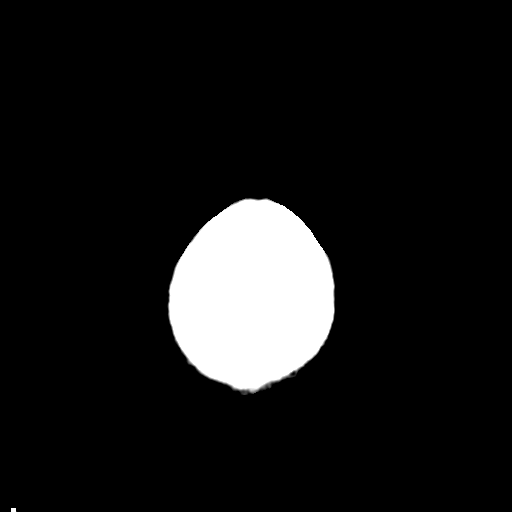
[im 28/31  bone]
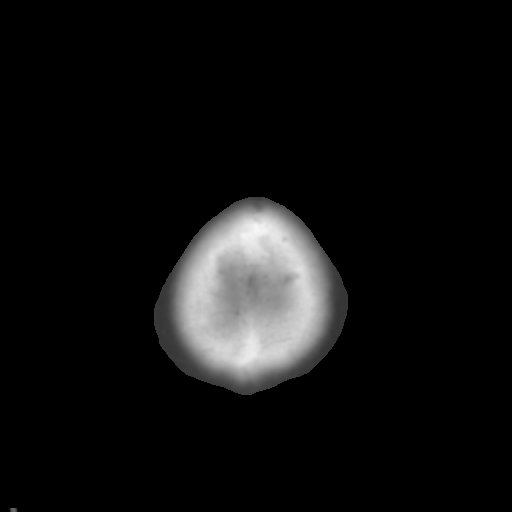

[Series 4: coronal soft tissue · coronal · 0.31mm/px · 3 of 62 slices shown]
[im 21/62  brain]
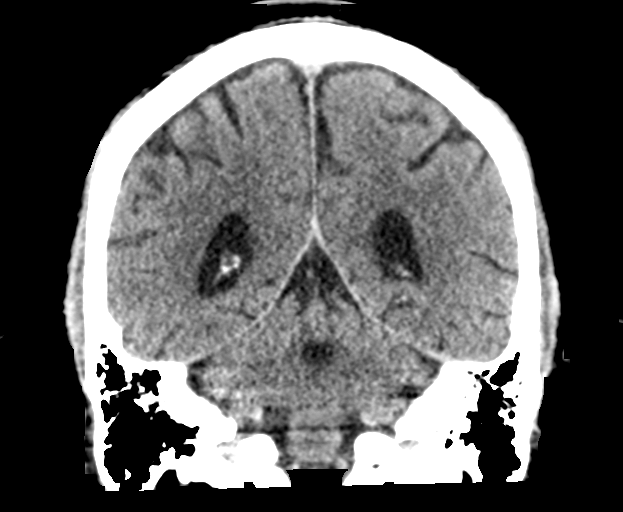
[im 28/62  brain]
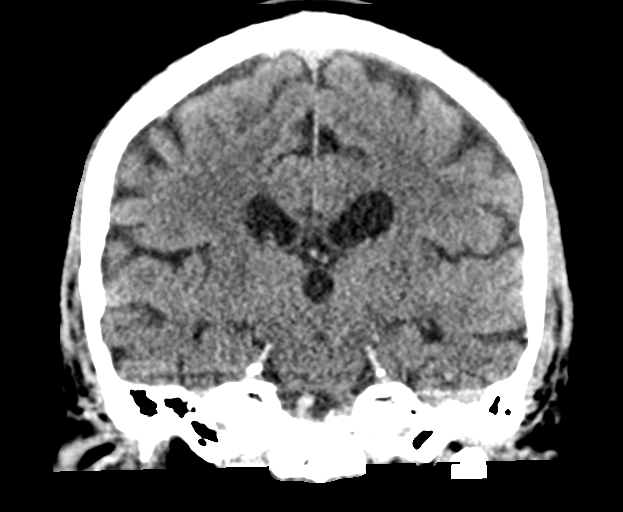
[im 34/62  brain]
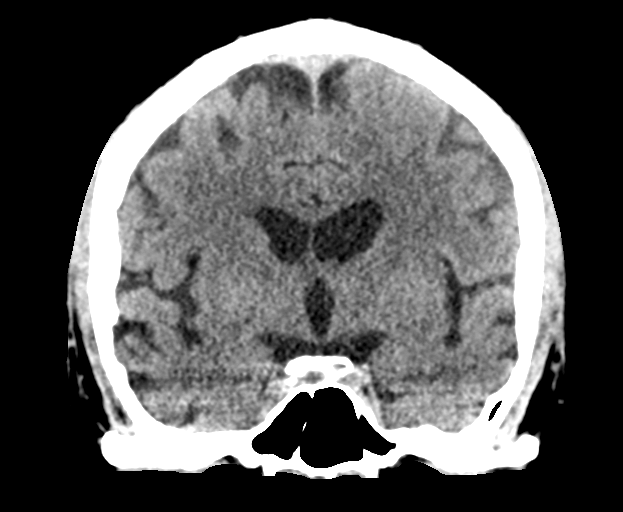

[Series 5: sagittal soft tissue · sagittal · 0.31mm/px · 3 of 49 slices shown]
[im 17/49  brain]
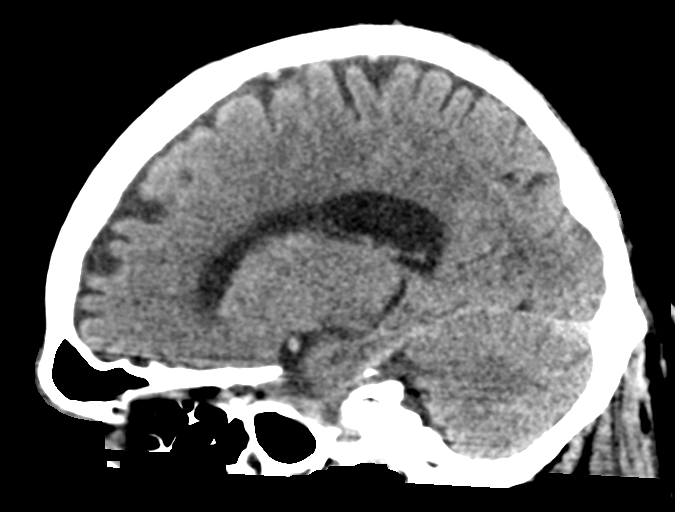
[im 25/49  brain]
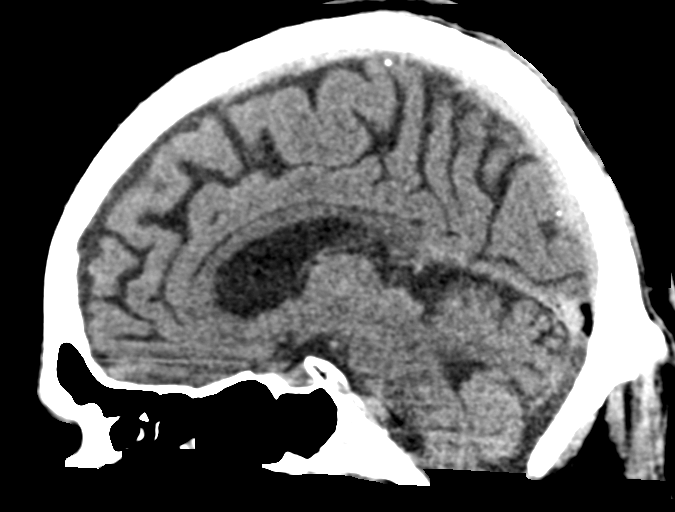
[im 33/49  brain]
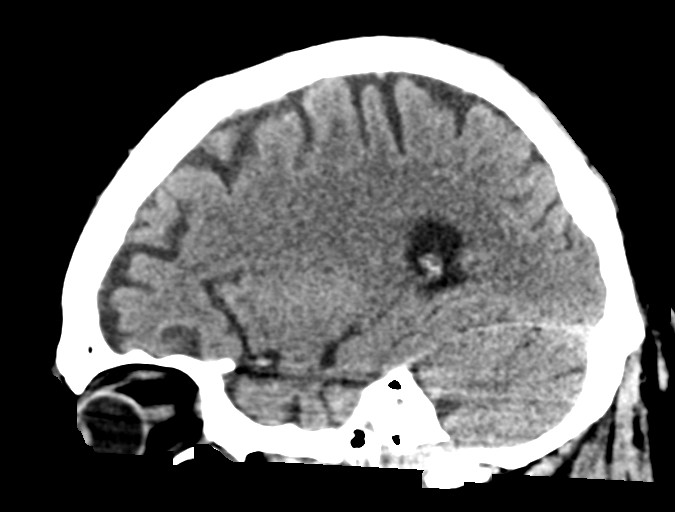

[15 of 47 positions shown; findings below may reference images not displayed]

FINDINGS: Brain: No evidence of acute infarction, hemorrhage, hydrocephalus,
extra-axial collection or mass lesion/mass effect.

Mild atrophy and probable chronic small-vessel white matter ischemic
changes noted.

Vascular: Atherosclerotic calcifications noted.

Skull: Normal. Negative for fracture or focal lesion.

Sinuses/Orbits: No acute finding.

Other: None.
IMPRESSION: 1. No evidence of acute intracranial abnormality.
2. Mild atrophy and probable chronic small-vessel white matter
ischemic changes.

## 2018-10-16 ENCOUNTER — Encounter: Payer: Self-pay | Admitting: Family Medicine

## 2018-10-18 ENCOUNTER — Telehealth: Payer: Self-pay | Admitting: Family Medicine

## 2018-10-18 NOTE — Telephone Encounter (Signed)
Staff message sent to Cole Harris to ok patient for participation in [employer] exercise program.

## 2018-11-02 ENCOUNTER — Encounter: Payer: Self-pay | Admitting: Family Medicine

## 2018-11-03 ENCOUNTER — Other Ambulatory Visit: Payer: Self-pay | Admitting: Family Medicine

## 2018-11-03 DIAGNOSIS — M545 Low back pain: Principal | ICD-10-CM

## 2018-11-03 DIAGNOSIS — G8929 Other chronic pain: Secondary | ICD-10-CM

## 2018-11-03 MED ORDER — HYDROCODONE-ACETAMINOPHEN 10-325 MG PO TABS: 1.0000 | ORAL_TABLET | Freq: Three times a day (TID) | ORAL | 0 refills | Status: DC | PRN

## 2018-11-09 ENCOUNTER — Telehealth: Payer: Self-pay

## 2018-11-09 NOTE — Telephone Encounter (Signed)
Cole Harris from [facility] needed verification of Dr Para March as supervisory physician's DEA #. [name] [name] voiced understanding and that was all needed.

## 2018-11-13 ENCOUNTER — Other Ambulatory Visit: Payer: Self-pay

## 2018-11-13 ENCOUNTER — Encounter: Payer: Self-pay | Admitting: Family Medicine

## 2018-11-13 DIAGNOSIS — G8929 Other chronic pain: Secondary | ICD-10-CM

## 2018-11-13 DIAGNOSIS — M545 Low back pain: Principal | ICD-10-CM

## 2018-12-20 ENCOUNTER — Encounter: Payer: Self-pay | Admitting: Family Medicine

## 2018-12-20 ENCOUNTER — Ambulatory Visit (INDEPENDENT_AMBULATORY_CARE_PROVIDER_SITE_OTHER): Payer: Medicare (Managed Care) | Admitting: Family Medicine

## 2018-12-20 VITALS — BP 127/86 | HR 99 | Temp 96.7°F | Ht 68.5 in | Wt 278.1 lb

## 2018-12-20 DIAGNOSIS — E1169 Type 2 diabetes mellitus with other specified complication: Secondary | ICD-10-CM | POA: Diagnosis not present

## 2018-12-20 DIAGNOSIS — R42 Dizziness and giddiness: Secondary | ICD-10-CM

## 2018-12-20 MED ORDER — SITAGLIPTIN PHOS-METFORMIN HCL 50-500 MG PO TABS: 1.0000 | ORAL_TABLET | Freq: Every day | ORAL | 3 refills | Status: DC

## 2018-12-20 NOTE — Progress Notes (Signed)
Virtual Visit via Video Note  I connected with [name] on 12/20/18 at  2:30 PM EDT by a video enabled telemedicine application and verified that I am speaking with the correct person using two identifiers. His daughter only logged nightly and his wife Alexia Freestone are also on the visit.  To bring her home and I was in my office.   I discussed the limitations of evaluation and management by telemedicine and the availability of in person appointments. The patient expressed understanding and agreed to proceed.  History of Present Illness: This is a 73 year old male who requests virtual video visit today to discuss low blood sugar.  This morning he slept then and when he awoke was feeling a little lightheaded.  When his daughter-in-law checked his blood sugar it was 66.  He had breakfast and a snack and his blood sugar came up to 140.  He is currently on Janumet 50-1000 mg daily.  His last hemoglobin A1c September 22, 2018 was 6.3.  Is been running in the 60s and 70s fasting with no readings greater than 145 at other times throughout the day.  He reports having some occasional lightheadedness with getting up from sitting.  He denies chest pain, shortness of breath, cough, fever.   Observations/Objective: Patient is alert and answers questions appropriately.  He is normally conversive.  He does not appear short of breath. BP 127/86 Comment: per patient  Pulse 99 Comment: per patient  Temp (!) 96.7 F (35.9 C) (Oral) Comment: per patient  Ht 5' 8.5" (1.74 m)   Wt 278 lb 2 oz (126.2 kg) Comment: per patient  BMI 41.67 kg/m  Wt Readings from Last 3 Encounters:  12/20/18 278 lb 2 oz (126.2 kg)  10/04/18 278 lb (126.1 kg)  09/22/18 273 lb 4 oz (123.9 kg)    Assessment and Plan: 1. Type 2 diabetes mellitus with other specified complication, without long-term current use of insulin (HCC) -We will decrease Janumet dose -Continue to monitor blood sugars at home and report if continued episodes of blood  sugar less than 70 may need to further decrease oral diabetes medications - sitaGLIPtin-metformin (JANUMET) 50-500 MG tablet; Take 1 tablet by mouth daily.  Dispense: 90 tablet; Refill: 3 Follow-up in 3 months, sooner if needed  2. Orthostatic lightheadedness -This is been ongoing, he has not had any recent falls, encouraged him to change positions slowly and hydrate adequately   Olean Ree, FNP-BC  [facility] at [facility], [facility]  12/20/2018 4:24 PM   Follow Up Instructions:    I discussed the assessment and treatment plan with the patient. The patient was provided an opportunity to ask questions and all were answered. The patient agreed with the plan and demonstrated an understanding of the instructions.   The patient was advised to call back or seek an in-person evaluation if the symptoms worsen or if the condition fails to improve as anticipated.  I provided 22 minutes of non-face-to-face time during this encounter.   Emi Belfast, FNP

## 2019-01-02 ENCOUNTER — Encounter: Payer: Self-pay | Admitting: Family Medicine

## 2019-01-11 ENCOUNTER — Encounter: Payer: Self-pay | Admitting: Family Medicine

## 2019-01-15 ENCOUNTER — Other Ambulatory Visit: Payer: Self-pay | Admitting: Family Medicine

## 2019-01-15 DIAGNOSIS — G8929 Other chronic pain: Secondary | ICD-10-CM

## 2019-01-15 MED ORDER — HYDROCODONE-ACETAMINOPHEN 10-325 MG PO TABS: 1.0000 | ORAL_TABLET | Freq: Three times a day (TID) | ORAL | 0 refills | Status: DC | PRN

## 2019-01-19 ENCOUNTER — Telehealth: Payer: Self-pay | Admitting: *Deleted

## 2019-01-19 NOTE — Telephone Encounter (Signed)
Per patient's wife patient did not take Janumet this morning since sugar reading was so low.

## 2019-01-19 NOTE — Telephone Encounter (Signed)
Please call patient's wife or daughter in law and tell them to hold the janumet for 5 days and send me Cole Harris' blood sugar readings. If elevated, I will start just one of the medications that is in the Janument (metformin). Let me know sooner if he has low readings (less than 70) off Janument.

## 2019-01-19 NOTE — Telephone Encounter (Signed)
Patient's wife left a voicemail stating that patient's glucose readings have been running low. Clydie Braun stated that his glucose reading haa been as low at 48. Clydie Braun stated that they feel that his diet is under control at this point. Clydie Braun stated that last night at 11:00 pm his blood sugar reading was 78. Clydie Braun stated that his blood sugar reading yesterday am was 58 and 3 days ago it was lower. Clydie Braun stated that his readings are usually running between 80-90. Clydie Braun stated that they are wondering if there should be some adjustments with his medication?

## 2019-01-19 NOTE — Telephone Encounter (Signed)
Notified patients daughter, Clydie Braun, she will hold the medication and follow up with [place] next week on how his readings are doing.

## 2019-01-19 NOTE — Telephone Encounter (Signed)
Please call patient's wife or daughter in law and verify medication. Is he still taking Janumet 50/500 twice a day? Is he eating normally?

## 2019-01-19 NOTE — Telephone Encounter (Signed)
Spoke with patient's wife. Patient is taking Janumet 50/500 1 tablet twice daily. When this change made for about 1 week after wife states readings were better- after May 5th- readings 119, 118, 127, 164.  And now consistently getting low sugars since May 14th. He is eating better, eating some low carb diet foods.

## 2019-01-26 ENCOUNTER — Encounter: Payer: Self-pay | Admitting: Family Medicine

## 2019-01-26 ENCOUNTER — Telehealth: Payer: Self-pay | Admitting: *Deleted

## 2019-01-26 ENCOUNTER — Other Ambulatory Visit: Payer: Self-pay | Admitting: Family Medicine

## 2019-01-26 DIAGNOSIS — Z794 Long term (current) use of insulin: Secondary | ICD-10-CM

## 2019-01-26 DIAGNOSIS — E1165 Type 2 diabetes mellitus with hyperglycemia: Secondary | ICD-10-CM

## 2019-01-26 MED ORDER — METFORMIN HCL ER 500 MG PO TB24: 500.0000 mg | ORAL_TABLET | Freq: Every day | ORAL | 5 refills | Status: DC

## 2019-01-26 NOTE — Telephone Encounter (Signed)
Sent Clydie Braun a Clinical cytogeneticist message with details of plan to add metformin xr 500 mg daily.

## 2019-01-26 NOTE — Telephone Encounter (Addendum)
Clydie Braun called stating that she is calling to give an update on patient. Clydie Braun stated that he has stopped his Janumet and his FBS today was 212 and yesterday it has been running around 120. Clydie Braun stated that his blood sugar is going up and down and it was 189 last night after dinner. Clydie Braun wanted to let you know that he fell a couple of weeks ago and fell out of bed last night. Clydie Braun stated that he is forgetting more, but does have a follow-up with neurology coming up soon. Clydie Braun stated that she can bring him in the office if he needs to be seen or communicate thru mychart. Clydie Braun stated to just let her know what she needs to do.  Clydie Braun stated that he told her that he did not hurt himself when he fell out of bed.Marland Kitchen

## 2019-01-30 ENCOUNTER — Other Ambulatory Visit: Payer: Self-pay | Admitting: Family Medicine

## 2019-01-31 NOTE — Telephone Encounter (Signed)
Last office visit 12/20/2018 for DM.  Last refilled 05/05/2018 for #30 with 1 refills.  CPE scheduled 10/03/2019.  Ok to refill?

## 2019-02-12 ENCOUNTER — Encounter: Payer: Self-pay | Admitting: Family Medicine

## 2019-02-14 NOTE — Telephone Encounter (Signed)
Best number [phone] Santiago Glad called to schedule appointment we schedule for 6/24 is ok or do you want pt to come in sooner  She cannot do Monday 6/22.

## 2019-02-16 ENCOUNTER — Encounter: Payer: Self-pay | Admitting: Family Medicine

## 2019-02-16 ENCOUNTER — Other Ambulatory Visit: Payer: Self-pay | Admitting: Family Medicine

## 2019-02-16 DIAGNOSIS — G8929 Other chronic pain: Secondary | ICD-10-CM

## 2019-02-16 MED ORDER — HYDROCODONE-ACETAMINOPHEN 10-325 MG PO TABS: 1.0000 | ORAL_TABLET | Freq: Three times a day (TID) | ORAL | 0 refills | Status: DC | PRN

## 2019-02-21 ENCOUNTER — Ambulatory Visit (INDEPENDENT_AMBULATORY_CARE_PROVIDER_SITE_OTHER): Payer: No Typology Code available for payment source | Admitting: Family Medicine

## 2019-02-21 ENCOUNTER — Encounter: Payer: Self-pay | Admitting: Family Medicine

## 2019-02-21 ENCOUNTER — Other Ambulatory Visit: Payer: Self-pay

## 2019-02-21 VITALS — BP 114/62 | HR 71 | Temp 96.6°F | Wt 281.5 lb

## 2019-02-21 DIAGNOSIS — M542 Cervicalgia: Secondary | ICD-10-CM | POA: Diagnosis not present

## 2019-02-21 DIAGNOSIS — G4733 Obstructive sleep apnea (adult) (pediatric): Secondary | ICD-10-CM

## 2019-02-21 DIAGNOSIS — G300 Alzheimer's disease with early onset: Secondary | ICD-10-CM

## 2019-02-21 DIAGNOSIS — E1169 Type 2 diabetes mellitus with other specified complication: Secondary | ICD-10-CM

## 2019-02-21 DIAGNOSIS — F0280 Dementia in other diseases classified elsewhere without behavioral disturbance: Secondary | ICD-10-CM

## 2019-02-21 NOTE — Progress Notes (Signed)
Subjective:    Patient ID: Cole Harris, male    DOB: [birth year], 73 y.o.   MRN: [id number]  HPI This is a 73 yo male, brought in by daughter in law, Cole Harris. He fell several weeks ago and has been intermittently complaining of neck pain. He denies pain currently, has been sleeping well. Activities are normal for him- a little walking around his house/yard, watching TV. He denies numbness or tingling in extremities, gait disturbance, weakness. He has been taking occasional hydrocodone-acetaminophen 10-325 and bid Alleve with good relief. Using home TENS unit which helps. No bowel or bladder incontinence. No constipation.  Blood sugars have stabilized on metformin only. No further low readings. He has been eating better as the entire family is doing a modified keto eating plan.  Recent visit with neurologist who recommended increased walking. Cole Harris was participating in cardiac rehab with his wife until the program was closed due to Covid. His DIL is concerned about them resuming the program since it recently opened.    Past Medical History:  Diagnosis Date  . Alzheimer's disease with early onset (Cole Harris) 08/24/2017  . Arthritis   . CAD (coronary artery disease) 08/24/2017  . Depression   . Diabetes mellitus without complication ([place])   . GERD (gastroesophageal reflux disease)   . Hyperlipidemia   . Hypertension   . Pulmonary hypertension ([place]) 08/24/2017  . Vertebrobasilar insufficiency 08/24/2017   Past Surgical History:  Procedure Laterality Date  . BACK SURGERY  1998  . INTERNAL TENS UNIT  2002  . TONSILLECTOMY  1955   Family History  Problem Relation Age of Onset  . Arthritis Mother   . Arthritis Father   . Hearing loss Father   . Heart disease Father   . Hyperlipidemia Father    Social History   Tobacco Use  . Smoking status: Former Research scientist (life sciences)  . Smokeless tobacco: Never Used  Substance Use Topics  . Alcohol use: No    Frequency: Never  . Drug use: No      Review of  Systems Per HPI    Objective:   Physical Exam Vitals signs reviewed.  Constitutional:      General: He is not in acute distress.    Appearance: Normal appearance. He is obese. He is not ill-appearing, toxic-appearing or diaphoretic.  HENT:     Head: Normocephalic and atraumatic.  Eyes:     Conjunctiva/sclera: Conjunctivae normal.  Neck:     Musculoskeletal: Normal range of motion and neck supple. No neck rigidity or muscular tenderness.  Cardiovascular:     Rate and Rhythm: Normal rate and regular rhythm.     Heart sounds: Normal heart sounds.  Pulmonary:     Effort: Pulmonary effort is normal.     Breath sounds: Normal breath sounds.  Musculoskeletal:     Cervical back: He exhibits normal range of motion, no tenderness, no bony tenderness, no swelling, no edema and no deformity.     Comments: Normal unassisted gait.  Strength 5/5 UE/LE Reflexes +2  Lymphadenopathy:     Cervical: No cervical adenopathy.  Skin:    General: Skin is warm and dry.  Neurological:     Mental Status: He is alert.     Comments: Alert and answers questions appropriately. Poor memory.        BP 114/62 (BP Location: Left Arm, Patient Position: Sitting, Cuff Size: Large)   Pulse 71   Temp (!) 96.6 F (35.9 C) (Tympanic)   Wt 244 lb (110.7  kg)   SpO2 94%   BMI 36.56 kg/m  Wt Readings from Last 3 Encounters:  02/21/19 281 lb 8 oz (127.7 kg)  12/20/18 278 lb 2 oz (126.2 kg)  10/04/18 278 lb (126.1 kg)        Assessment & Plan:  1. Neck pain - no concerning findings on history or physical exam, he has a history of neck surgery many years ago which is probably contributing to intermittent pain.  - pain managed with current medication and home TENS unit.  - RTC precautions reviewed  2. Early onset Alzheimer's dementia without behavioral disturbance (HCC) - continued neurology follow up and encouraged patient to increase activity - discussed return to cardiac rehab, pros/cons with pandemic   3. Type 2 diabetes mellitus with other specified complication, without long-term current use of insulin (HCC) - blood sugars improved, will check hgba1c at follow up  4. OSA treated with BiPap - has been having difficulty keeping mask on, they will follow up with medical supply company to see if different mask would be better.   Olean Reeeborah Caragh Gasper, FNP-BC  [facility], [facility]  02/24/2019 7:06 AM

## 2019-02-21 NOTE — Patient Instructions (Signed)
For your neck- continue to use your TENS unit.   Keep me posted about your blood sugars if high or low

## 2019-02-24 ENCOUNTER — Encounter: Payer: Self-pay | Admitting: Family Medicine

## 2019-02-24 DIAGNOSIS — G4733 Obstructive sleep apnea (adult) (pediatric): Secondary | ICD-10-CM | POA: Insufficient documentation

## 2019-03-13 ENCOUNTER — Encounter: Payer: Self-pay | Admitting: Family Medicine

## 2019-03-14 ENCOUNTER — Other Ambulatory Visit: Payer: Self-pay | Admitting: Family Medicine

## 2019-03-14 DIAGNOSIS — G8929 Other chronic pain: Secondary | ICD-10-CM

## 2019-03-14 MED ORDER — HYDROCODONE-ACETAMINOPHEN 10-325 MG PO TABS: 1.0000 | ORAL_TABLET | Freq: Three times a day (TID) | ORAL | 0 refills | Status: DC | PRN

## 2019-03-28 ENCOUNTER — Encounter: Payer: Self-pay | Admitting: Family Medicine

## 2019-03-29 ENCOUNTER — Ambulatory Visit (INDEPENDENT_AMBULATORY_CARE_PROVIDER_SITE_OTHER): Payer: No Typology Code available for payment source | Admitting: Family Medicine

## 2019-03-29 ENCOUNTER — Encounter: Payer: Self-pay | Admitting: Family Medicine

## 2019-03-29 ENCOUNTER — Ambulatory Visit (INDEPENDENT_AMBULATORY_CARE_PROVIDER_SITE_OTHER)
Admission: RE | Admit: 2019-03-29 | Discharge: 2019-03-29 | Disposition: A | Discharge status: Home / Self Care | Payer: No Typology Code available for payment source | Source: Ambulatory Visit | Attending: Family Medicine | Admitting: Family Medicine

## 2019-03-29 ENCOUNTER — Other Ambulatory Visit: Payer: Self-pay

## 2019-03-29 ENCOUNTER — Other Ambulatory Visit: Payer: Self-pay | Admitting: Family Medicine

## 2019-03-29 VITALS — BP 100/68 | HR 79 | Temp 98.2°F | Ht 68.5 in | Wt 278.0 lb

## 2019-03-29 DIAGNOSIS — Z981 Arthrodesis status: Secondary | ICD-10-CM

## 2019-03-29 DIAGNOSIS — M5412 Radiculopathy, cervical region: Secondary | ICD-10-CM

## 2019-03-29 DIAGNOSIS — M66321 Spontaneous rupture of flexor tendons, right upper arm: Secondary | ICD-10-CM | POA: Diagnosis not present

## 2019-03-29 DIAGNOSIS — G8929 Other chronic pain: Secondary | ICD-10-CM

## 2019-03-29 MED ORDER — PREDNISONE 20 MG PO TABS: ORAL_TABLET | ORAL | 0 refills | Status: DC

## 2019-03-29 MED ORDER — HYDROCODONE-ACETAMINOPHEN 10-325 MG PO TABS: 1.0000 | ORAL_TABLET | Freq: Three times a day (TID) | ORAL | 0 refills | Status: DC | PRN
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
## 2019-03-29 NOTE — Patient Instructions (Signed)
Lidocaine 4% cream, you can use about 4 times a day or more if bad pain. Marca Ancona Salonpas or Aspercreme 4% lidocaine, ask for the generic

## 2019-04-10 ENCOUNTER — Encounter: Payer: Self-pay | Admitting: Family Medicine

## 2019-05-01 ENCOUNTER — Other Ambulatory Visit: Payer: Self-pay | Admitting: Family Medicine

## 2019-05-01 DIAGNOSIS — G8929 Other chronic pain: Secondary | ICD-10-CM

## 2019-05-01 NOTE — Telephone Encounter (Signed)
Name of Medication: Hydrocodone apap 10-325 mg Name of Pharmacy:[facility]  Last Fill or Written Date and Quantity: # 30 on 03/29/19 Last Office Visit and Type: 03/29/19 neck pain Next Office Visit and Type: 05/23/19 for 3 mth FU Last Controlled Substance Agreement Date:none  Last [id number]

## 2019-05-02 ENCOUNTER — Other Ambulatory Visit: Payer: Self-pay | Admitting: Family Medicine

## 2019-05-02 MED ORDER — HYDROCODONE-ACETAMINOPHEN 10-325 MG PO TABS: 1.0000 | ORAL_TABLET | Freq: Three times a day (TID) | ORAL | 0 refills | Status: DC | PRN

## 2019-05-03 ENCOUNTER — Encounter: Payer: Self-pay | Admitting: Family Medicine

## 2019-05-04 MED ORDER — FENOFIBRATE 145 MG PO TABS: 145.0000 mg | ORAL_TABLET | Freq: Every day | ORAL | 0 refills | Status: DC

## 2019-05-04 NOTE — Telephone Encounter (Signed)
See mychart message - waiting to see where patient would like Rx sent.

## 2019-05-09 NOTE — Telephone Encounter (Signed)
Needs to go to mail order. [facility]. Refill sent for those I could. Sending others to PCP.  Last filled: Meclizine on 01/31/2019 #90 with 0 refill       Klor Con and Furosemide both were filled on 05/05/2018 for #90 with 3 refills.  LOV 03/29/2019 with Dr Lorelei Pont Future appointment with Jackelyn Poling on 05/23/2019

## 2019-05-09 NOTE — Telephone Encounter (Signed)
Left message for patient to call back also today

## 2019-05-09 NOTE — Telephone Encounter (Signed)
Left message for patient to call back about this also, see refill request on other medications

## 2019-05-23 ENCOUNTER — Other Ambulatory Visit: Payer: Self-pay

## 2019-05-23 ENCOUNTER — Ambulatory Visit (INDEPENDENT_AMBULATORY_CARE_PROVIDER_SITE_OTHER): Payer: No Typology Code available for payment source | Admitting: Family Medicine

## 2019-05-23 ENCOUNTER — Encounter: Payer: Self-pay | Admitting: Family Medicine

## 2019-05-23 VITALS — BP 104/60 | HR 65 | Temp 98.5°F | Wt 280.4 lb

## 2019-05-23 DIAGNOSIS — F0280 Dementia in other diseases classified elsewhere without behavioral disturbance: Secondary | ICD-10-CM

## 2019-05-23 DIAGNOSIS — G300 Alzheimer's disease with early onset: Secondary | ICD-10-CM | POA: Diagnosis not present

## 2019-05-23 DIAGNOSIS — Z23 Encounter for immunization: Secondary | ICD-10-CM | POA: Diagnosis not present

## 2019-05-23 DIAGNOSIS — G8929 Other chronic pain: Secondary | ICD-10-CM

## 2019-05-23 DIAGNOSIS — M542 Cervicalgia: Secondary | ICD-10-CM | POA: Diagnosis not present

## 2019-05-23 DIAGNOSIS — E1149 Type 2 diabetes mellitus with other diabetic neurological complication: Secondary | ICD-10-CM

## 2019-05-23 NOTE — Patient Instructions (Signed)
Good to see you today!  

## 2019-05-23 NOTE — Progress Notes (Signed)
Subjective:    Patient ID: Cole Harris, male    DOB: 1946-07-14, 73 y.o.   MRN: [id number]  HPI This is a 73 yo male, accompanied by his daughter-in-law Santiago Glad, who presents today for follow up.  He reports that he has been doing pretty well.  Diabetes mellitus type 2- blood sugars generally run below 150 unless he indulges in cereal or chips which she occasionally does.  He is currently on metformin XR 500 mg once a day.  He has not had any low blood sugar episodes.  Chronic neck pain- bothers him intermittently.  He occasionally takes hydrocodone acetaminophen 10 mg if over-the-counter NSAIDs do not work.  Sleep- he is sleeping well without any concerns for safety.  ROS-no recent falls, he denies headache, chest pain, shortness of breath, cough, diarrhea/constipation/abdominal pain, lower extremity edema.  Past Medical History:  Diagnosis Date  . Alzheimer's disease with early onset ([place]) 08/24/2017  . Arthritis   . CAD (coronary artery disease) 08/24/2017  . Depression   . Diabetes mellitus without complication ([place])   . GERD (gastroesophageal reflux disease)   . Hyperlipidemia   . Hypertension   . Pulmonary hypertension ([place]) 08/24/2017  . Vertebrobasilar insufficiency 08/24/2017   Past Surgical History:  Procedure Laterality Date  . BACK SURGERY  1998  . INTERNAL TENS UNIT  2002  . TONSILLECTOMY  1955   Family History  Problem Relation Age of Onset  . Arthritis Mother   . Arthritis Father   . Hearing loss Father   . Heart disease Father   . Hyperlipidemia Father    Social History   Tobacco Use  . Smoking status: Former Research scientist (life sciences)  . Smokeless tobacco: Never Used  Substance Use Topics  . Alcohol use: No    Frequency: Never  . Drug use: No      Review of Systems Per HPI    Objective:   Physical Exam Vitals signs reviewed.  HENT:     Head: Normocephalic and atraumatic.  Eyes:     Conjunctiva/sclera: Conjunctivae normal.  Cardiovascular:     Rate  and Rhythm: Normal rate and regular rhythm.     Heart sounds: Normal heart sounds.  Pulmonary:     Effort: Pulmonary effort is normal.     Breath sounds: Normal breath sounds.  Musculoskeletal:     Right lower leg: Edema (trace) present.     Left lower leg: Edema (trace) present.  Skin:    General: Skin is warm and dry.  Neurological:     Mental Status: He is alert and oriented to person, place, and time.  Psychiatric:        Mood and Affect: Mood normal.        Behavior: Behavior normal.        Thought Content: Thought content normal.        Judgment: Judgment normal.       BP 104/60   Pulse 65   Temp 98.5 F (36.9 C)   Wt 280 lb 6.4 oz (127.2 kg)   SpO2 98%   BMI 42.01 kg/m  Wt Readings from Last 3 Encounters:  05/23/19 280 lb 6.4 oz (127.2 kg)  03/29/19 278 lb (126.1 kg)  02/21/19 281 lb 8 oz (127.7 kg)       Assessment & Plan:  1. Need for immunization against influenza - Flu Vaccine QUAD High Dose(Fluad)  2. Type 2 diabetes mellitus with other neurologic complication, without long-term current use of insulin (HCC) -  Hemoglobin A1c - Microalbumin / creatinine urine ratio  3. Early onset Alzheimer's dementia without behavioral disturbance (HCC) -Currently managing well in home setting with good family support -Follow-up with neurology as scheduled  4. Neck pain, chronic -Stable, he has pain medicine available for occasional use for severe pain if needed  -Follow-up in 6 months Olean Ree, FNP-BC  [facility], [facility]  05/23/2019 4:57 PM

## 2019-05-24 LAB — MICROALBUMIN / CREATININE URINE RATIO
Creatinine,U: 31.7 mg/dL
Microalb Creat Ratio: 2.2 mg/g (ref 0.0–30.0)
Microalb, Ur: <0.7 mg/dL (ref 0.0–1.9)

## 2019-05-24 LAB — HEMOGLOBIN A1C: Hgb A1c MFr Bld: 8 % — ABNORMAL HIGH (ref 4.6–6.5)

## 2019-05-25 ENCOUNTER — Encounter: Payer: Self-pay | Admitting: Family Medicine

## 2019-05-28 ENCOUNTER — Other Ambulatory Visit: Payer: Self-pay | Admitting: Family Medicine

## 2019-05-28 DIAGNOSIS — Z794 Long term (current) use of insulin: Secondary | ICD-10-CM

## 2019-05-28 DIAGNOSIS — E1165 Type 2 diabetes mellitus with hyperglycemia: Secondary | ICD-10-CM

## 2019-05-28 MED ORDER — METFORMIN HCL ER 500 MG PO TB24: 500.0000 mg | ORAL_TABLET | Freq: Two times a day (BID) | ORAL | 1 refills | Status: DC

## 2019-05-30 ENCOUNTER — Encounter: Payer: Self-pay | Admitting: Family Medicine

## 2019-06-08 ENCOUNTER — Encounter: Payer: Self-pay | Admitting: Family Medicine

## 2019-06-11 ENCOUNTER — Other Ambulatory Visit: Payer: Self-pay | Admitting: Family Medicine

## 2019-06-11 DIAGNOSIS — E1149 Type 2 diabetes mellitus with other diabetic neurological complication: Secondary | ICD-10-CM

## 2019-06-11 MED ORDER — FREESTYLE LIBRE 14 DAY SENSOR MISC: 1.0000 [IU] | 3 refills | Status: DC

## 2019-06-11 NOTE — Telephone Encounter (Signed)
Form received this afternoon, given to Ethete.

## 2019-06-11 NOTE — Telephone Encounter (Signed)
I do not have a fax RX request. Have you had one come to your paper inbasket Debbie?

## 2019-06-27 ENCOUNTER — Encounter: Payer: Self-pay | Admitting: Family Medicine

## 2019-07-03 ENCOUNTER — Telehealth: Payer: Self-pay

## 2019-07-03 NOTE — Telephone Encounter (Signed)
Cole Harris with [facility] left v/m requesting status of fax for chart note updates for BS testing time and insulin injection time and adjustments. Please advise.

## 2019-07-04 ENCOUNTER — Encounter: Payer: Self-pay | Admitting: Family Medicine

## 2019-07-04 NOTE — Telephone Encounter (Signed)
Spoke with Nicole Kindred at [facility] that they were needing verification that the patient is testing BS 4 times a day and taking insulin 3 times a day with titration or sliding scale.  We need to verify with the patient if he is checking his BS and how often - per chart notes he is doing neither. Pt is taking Metformin XR 500mg   Pt cannot get Freestyle Libre unless he is on an injectable (insulin) AND checking BS QID or more.  Will send to [facility] as Juluis Rainier

## 2019-07-04 NOTE — Telephone Encounter (Signed)
I have sent patient's daughter in law a message to update her.

## 2019-07-25 ENCOUNTER — Other Ambulatory Visit: Payer: Self-pay | Admitting: Family Medicine

## 2019-07-26 ENCOUNTER — Other Ambulatory Visit: Payer: Self-pay | Admitting: Family Medicine

## 2019-08-21 ENCOUNTER — Telehealth: Payer: Self-pay | Admitting: Family Medicine

## 2019-08-21 NOTE — Telephone Encounter (Signed)
Received a fax from [facility] Fenofibrate is on backorder -- [facility] be [facility]   Please advise, thanks.   No Known Allergies

## 2019-08-22 NOTE — Telephone Encounter (Signed)
Please call patient's daughter in law and see if he typically gets this from mail order pharmacy? Is he out?

## 2019-08-22 NOTE — Telephone Encounter (Signed)
Left message for Karen to return my call

## 2019-08-27 ENCOUNTER — Other Ambulatory Visit: Payer: Self-pay | Admitting: Family Medicine

## 2019-08-27 DIAGNOSIS — E1165 Type 2 diabetes mellitus with hyperglycemia: Secondary | ICD-10-CM

## 2019-08-27 DIAGNOSIS — Z794 Long term (current) use of insulin: Secondary | ICD-10-CM

## 2019-08-28 NOTE — Telephone Encounter (Signed)
Last OV 05/23/19 Upcoming OV 10/03/2019 with Tor Netters, FNP  Both Wellbutrin and Lexapro refilled on 07/30/2019 #90 x 0 refills each  Please advise, thanks.

## 2019-08-29 NOTE — Telephone Encounter (Signed)
LM for daughter Santiago Glad to return x 2

## 2019-08-30 NOTE — Telephone Encounter (Signed)
Message left for patient to return my call.  

## 2019-09-06 NOTE — Telephone Encounter (Signed)
Left another message requesting a call back - will close this encounter.   Mychart message sent as well.

## 2019-09-07 ENCOUNTER — Other Ambulatory Visit: Payer: Self-pay | Admitting: Family Medicine

## 2019-09-10 NOTE — Telephone Encounter (Signed)
  Last refilled 05/09/19 #90 x 3 refills - 1 year supply - [facility].  Pt should not need a refill at this time.

## 2019-09-11 NOTE — Telephone Encounter (Signed)
Patient's daughter in law called the office and stated that they do need an alternative of Fenofibrate sent in to mail order pharmacy. Will route to [place]. Thank you!

## 2019-09-12 ENCOUNTER — Other Ambulatory Visit: Payer: Self-pay | Admitting: Family Medicine

## 2019-09-12 ENCOUNTER — Encounter: Payer: Self-pay | Admitting: Family Medicine

## 2019-09-12 DIAGNOSIS — E782 Mixed hyperlipidemia: Secondary | ICD-10-CM

## 2019-09-12 MED ORDER — GEMFIBROZIL 600 MG PO TABS: 600.0000 mg | ORAL_TABLET | Freq: Two times a day (BID) | ORAL | 2 refills | Status: DC

## 2019-09-12 NOTE — Telephone Encounter (Signed)
Gemfibrozil rx sent to patient's pharmacy. Mychart message sent to patient's DIL.

## 2019-09-12 NOTE — Progress Notes (Signed)
Fenofibrate on back order. Gemfibrozil sent to patient's pharmacy.

## 2019-09-26 ENCOUNTER — Ambulatory Visit: Payer: No Typology Code available for payment source

## 2019-10-03 ENCOUNTER — Encounter: Payer: Self-pay | Admitting: Family Medicine

## 2019-10-03 ENCOUNTER — Ambulatory Visit (INDEPENDENT_AMBULATORY_CARE_PROVIDER_SITE_OTHER): Payer: No Typology Code available for payment source | Admitting: Family Medicine

## 2019-10-03 ENCOUNTER — Other Ambulatory Visit: Payer: Self-pay

## 2019-10-03 VITALS — BP 104/62 | HR 90 | Temp 97.0°F | Ht 68.5 in | Wt 256.8 lb

## 2019-10-03 DIAGNOSIS — E785 Hyperlipidemia, unspecified: Secondary | ICD-10-CM | POA: Diagnosis not present

## 2019-10-03 DIAGNOSIS — G8929 Other chronic pain: Secondary | ICD-10-CM

## 2019-10-03 DIAGNOSIS — M542 Cervicalgia: Secondary | ICD-10-CM

## 2019-10-03 DIAGNOSIS — E1149 Type 2 diabetes mellitus with other diabetic neurological complication: Secondary | ICD-10-CM | POA: Diagnosis not present

## 2019-10-03 DIAGNOSIS — G300 Alzheimer's disease with early onset: Secondary | ICD-10-CM | POA: Diagnosis not present

## 2019-10-03 DIAGNOSIS — F0280 Dementia in other diseases classified elsewhere without behavioral disturbance: Secondary | ICD-10-CM

## 2019-10-03 MED ORDER — 1ST MEDX-PATCH/ LIDOCAINE 4-0.0375-5-20 % EX PTCH: 1.0000 | MEDICATED_PATCH | CUTANEOUS | 5 refills | Status: DC

## 2019-10-03 NOTE — Patient Instructions (Signed)
Good to see you today  Please follow up in 6 months  I have sent in a lidocaine patch to your pharmacy, let me know if you have any problems

## 2019-10-03 NOTE — Progress Notes (Signed)
Subjective:    Patient ID: Cole Harris, male    DOB: 1946/07/02, 74 y.o.   MRN: [id number]  HPI This is a 74 yo male, accompanied by his wife and son, Heath Lark. He presents today for follow up of chronic medical conditions.   Alzheimer's disease with early onset- followed by Dr. Melrose Nakayama, neurology. No changes. Sleeping well, no wandering, no erratic behavior.   Diabetes mellitus- has had significant weight loss, was 295 pounds 11/18, today 256 pounds. Has been able to come off his insulin. Follows modified ketogenic diet (some dietary indiscretion, enjoys a daily bowl of cereal). Blood sugars at home under 200. Currently on metformin xl 500 mg BID. Hgba1c 10/03/19- 6.9, down from 8.0 05/22/20.   Right sided neck pain- chronic in nature, Uses TENS unit with some relief, heat, occasional hydrocodone- acetaminophen 10-325.     Review of Systems Denies chest pain, SOB, abdominal pain, nausea/ vomiting, diarrhea/constipation. Leg swelling.     Objective:   Physical Exam Vitals reviewed.  Constitutional:      Appearance: Normal appearance. He is obese.  HENT:     Head: Normocephalic and atraumatic.  Eyes:     Conjunctiva/sclera: Conjunctivae normal.  Cardiovascular:     Rate and Rhythm: Normal rate and regular rhythm.     Pulses: Normal pulses.     Heart sounds: Normal heart sounds.  Pulmonary:     Effort: Pulmonary effort is normal.     Breath sounds: Normal breath sounds.  Abdominal:     General: Abdomen is flat. Bowel sounds are normal. There is no distension.     Palpations: Abdomen is soft. There is no mass.     Tenderness: There is no abdominal tenderness. There is no guarding or rebound.     Hernia: No hernia is present.  Musculoskeletal:     Cervical back: Tenderness (right trapezius) present.  Neurological:     Mental Status: He is alert.       BP 104/62   Pulse 90   Temp (!) 97 F (36.1 C)   Ht 5' 8.5" (1.74 m)   Wt 256 lb 12.8 oz (116.5 kg)   SpO2 93%   BMI  38.48 kg/m  Wt Readings from Last 3 Encounters:  10/03/19 256 lb 12.8 oz (116.5 kg)  05/23/19 280 lb 6.4 oz (127.2 kg)  03/29/19 278 lb (126.1 kg)   Diabetic Foot Exam - Simple   Simple Foot Form Diabetic Foot exam was performed with the following findings: Yes 10/03/2019  3:15 PM  Visual Inspection No deformities, no ulcerations, no other skin breakdown bilaterally: Yes Sensation Testing Intact to touch and monofilament testing bilaterally: Yes Pulse Check Posterior Tibialis and Dorsalis pulse intact bilaterally: Yes Comments        Assessment & Plan:  1. Hyperlipidemia, unspecified hyperlipidemia type - Comprehensive metabolic panel - Lipid Panel  2. Type 2 diabetes mellitus with other neurologic complication, without long-term current use of insulin (HCC) - Hemoglobin A1c - Comprehensive metabolic panel - Lipid Panel  3. Early onset Alzheimer's dementia without behavioral disturbance ([place]) - stable, continue memantine-donepezil, follow up with neurology  4. Neck pain, chronic - discussed use of acetaminophen, occasional hydrocodone/acetaminophen use, TENS unit, gentle range of motion, heat - prescription for liocaine-capsaicin-men-menthyl sal patches sent to patient's pharmacy  - follow up in 6 months  This visit occurred during the SARS-CoV-2 public health emergency.  Safety protocols were in place, including screening questions prior to the visit, additional usage of  staff PPE, and extensive cleaning of exam room while observing appropriate contact time as indicated for disinfecting solutions.    Olean Ree, FNP-BC   Primary Care at [facility], [facility]  10/06/2019 5:16 PM

## 2019-10-04 LAB — LIPID PANEL
Cholesterol: 150 mg/dL (ref 0–200)
HDL: 34.6 mg/dL — ABNORMAL LOW (ref >39.00)
LDL Cholesterol: 88 mg/dL (ref 0–99)
NonHDL: 115.74
Total CHOL/HDL Ratio: 4
Triglycerides: 141 mg/dL (ref 0.0–149.0)
VLDL: 28.2 mg/dL (ref 0.0–40.0)

## 2019-10-04 LAB — COMPREHENSIVE METABOLIC PANEL
ALT: 11 U/L (ref 0–53)
AST: 13 U/L (ref 0–37)
Albumin: 4.4 g/dL (ref 3.5–5.2)
Alkaline Phosphatase: 39 U/L (ref 39–117)
BUN: 20 mg/dL (ref 6–23)
CO2: 27 mEq/L (ref 19–32)
Calcium: 10.4 mg/dL (ref 8.4–10.5)
Chloride: 101 mEq/L (ref 96–112)
Creatinine, Ser: 1.24 mg/dL (ref 0.40–1.50)
GFR: 57.25 mL/min — ABNORMAL LOW (ref >60.00)
Glucose, Bld: 136 mg/dL — ABNORMAL HIGH (ref 70–99)
Potassium: 4.5 mEq/L (ref 3.5–5.1)
Sodium: 137 mEq/L (ref 135–145)
Total Bilirubin: 0.5 mg/dL (ref 0.2–1.2)
Total Protein: 7.1 g/dL (ref 6.0–8.3)

## 2019-10-04 LAB — HEMOGLOBIN A1C: Hgb A1c MFr Bld: 6.9 % — ABNORMAL HIGH (ref 4.6–6.5)

## 2019-10-06 ENCOUNTER — Encounter: Payer: Self-pay | Admitting: Family Medicine

## 2019-10-14 ENCOUNTER — Ambulatory Visit: Payer: Medicare Other | Attending: Internal Medicine

## 2019-10-14 DIAGNOSIS — Z23 Encounter for immunization: Secondary | ICD-10-CM | POA: Insufficient documentation

## 2019-10-14 NOTE — Progress Notes (Signed)
   Covid-19 Vaccination Clinic  Name:  Cole Harris    MRN: [id number] DOB: [birth year]  10/14/2019  [name] was observed post Covid-19 immunization for 15 minutes without incidence. He was provided with Vaccine Information Sheet and instruction to access the V-Safe system.   [name] was instructed to call 911 with any severe reactions post vaccine: Marland Kitchen Difficulty breathing  . Swelling of your face and throat  . A fast heartbeat  . A bad rash all over your body  . Dizziness and weakness    Immunizations Administered    Name Date Dose VIS Date Route   Pfizer COVID-19 Vaccine 10/14/2019  1:22 PM 0.3 mL 08/10/2019 Intramuscular   Manufacturer: [employer], [employer]   Lot: [id number]   NDC: [address]-8

## 2019-10-18 ENCOUNTER — Encounter: Payer: Self-pay | Admitting: Family Medicine

## 2019-10-19 ENCOUNTER — Other Ambulatory Visit: Payer: Self-pay | Admitting: Family Medicine

## 2019-10-19 MED ORDER — NIACIN ER (ANTIHYPERLIPIDEMIC) 500 MG PO TBCR: 500.0000 mg | EXTENDED_RELEASE_TABLET | Freq: Every day | ORAL | 3 refills | Status: DC

## 2019-11-06 ENCOUNTER — Encounter: Payer: Self-pay | Admitting: Family Medicine

## 2019-11-06 ENCOUNTER — Ambulatory Visit: Payer: Medicare Other | Attending: Internal Medicine

## 2019-11-06 DIAGNOSIS — Z23 Encounter for immunization: Secondary | ICD-10-CM | POA: Insufficient documentation

## 2019-11-06 NOTE — Progress Notes (Signed)
   Covid-19 Vaccination Clinic  Name:  Cole Harris    MRN: [id number] DOB: 05-Jun-1946  11/06/2019  [name] was observed post Covid-19 immunization for 15 minutes without incident. He was provided with Vaccine Information Sheet and instruction to access the V-Safe system.   [name] was instructed to call 911 with any severe reactions post vaccine: Marland Kitchen Difficulty breathing  . Swelling of face and throat  . A fast heartbeat  . A bad rash all over body  . Dizziness and weakness   Immunizations Administered    Name Date Dose VIS Date Route   Pfizer COVID-19 Vaccine 11/06/2019  6:51 PM 0.3 mL 08/10/2019 Intramuscular   Manufacturer: [employer], [employer]   Lot: [id number]   NDC: [address]-7

## 2019-11-07 ENCOUNTER — Ambulatory Visit: Payer: Medicare Other

## 2019-11-07 ENCOUNTER — Other Ambulatory Visit: Payer: Self-pay | Admitting: Family Medicine

## 2019-11-07 DIAGNOSIS — G8929 Other chronic pain: Secondary | ICD-10-CM

## 2019-11-07 MED ORDER — HYDROCODONE-ACETAMINOPHEN 10-325 MG PO TABS: 1.0000 | ORAL_TABLET | Freq: Three times a day (TID) | ORAL | 0 refills | Status: DC | PRN

## 2019-11-07 NOTE — Telephone Encounter (Addendum)
Last OV 10/03/2019 Last refill of Hydrocodone 05/02/2019 #30 x 0 refills.   Please advise, thanks.

## 2019-12-28 ENCOUNTER — Encounter: Payer: Self-pay | Admitting: Family Medicine

## 2019-12-28 ENCOUNTER — Other Ambulatory Visit: Payer: Self-pay

## 2019-12-28 DIAGNOSIS — E782 Mixed hyperlipidemia: Secondary | ICD-10-CM

## 2019-12-28 DIAGNOSIS — E1165 Type 2 diabetes mellitus with hyperglycemia: Secondary | ICD-10-CM

## 2019-12-28 DIAGNOSIS — Z794 Long term (current) use of insulin: Secondary | ICD-10-CM

## 2019-12-28 MED ORDER — METFORMIN HCL ER 500 MG PO TB24: 500.0000 mg | ORAL_TABLET | Freq: Two times a day (BID) | ORAL | 0 refills | Status: DC

## 2019-12-28 MED ORDER — GEMFIBROZIL 600 MG PO TABS: 600.0000 mg | ORAL_TABLET | Freq: Two times a day (BID) | ORAL | 1 refills | Status: DC

## 2020-01-01 ENCOUNTER — Ambulatory Visit: Payer: Medicare Other

## 2020-01-01 ENCOUNTER — Telehealth: Payer: Self-pay

## 2020-01-01 NOTE — Telephone Encounter (Signed)
Called patient 3 times trying to complete medicare visit. Patient never answered. Left message notifying appointment will be cancelled and to call office so we can get him rescheduled.

## 2020-01-21 ENCOUNTER — Encounter: Payer: Self-pay | Admitting: Family Medicine

## 2020-01-24 NOTE — Telephone Encounter (Signed)
Pt is requesting refills of the below medication be sent to [facility] in [place].   Please advise if okay to refill to out of state pharmacy. Thanks.

## 2020-01-25 ENCOUNTER — Other Ambulatory Visit: Payer: Self-pay | Admitting: Family Medicine

## 2020-01-25 MED ORDER — OMEPRAZOLE 20 MG PO CPDR: 20.0000 mg | DELAYED_RELEASE_CAPSULE | Freq: Every day | ORAL | 3 refills | Status: DC

## 2020-01-25 MED ORDER — NIACIN ER (ANTIHYPERLIPIDEMIC) 500 MG PO TBCR: 500.0000 mg | EXTENDED_RELEASE_TABLET | Freq: Every day | ORAL | 3 refills | Status: DC

## 2020-02-15 ENCOUNTER — Other Ambulatory Visit: Payer: Self-pay | Admitting: Family Medicine

## 2020-02-15 DIAGNOSIS — E782 Mixed hyperlipidemia: Secondary | ICD-10-CM

## 2020-02-15 DIAGNOSIS — E1165 Type 2 diabetes mellitus with hyperglycemia: Secondary | ICD-10-CM

## 2020-04-11 ENCOUNTER — Encounter: Payer: Self-pay | Admitting: Family Medicine

## 2020-04-11 ENCOUNTER — Other Ambulatory Visit: Payer: Self-pay

## 2020-04-11 ENCOUNTER — Ambulatory Visit (INDEPENDENT_AMBULATORY_CARE_PROVIDER_SITE_OTHER): Payer: No Typology Code available for payment source | Admitting: Family Medicine

## 2020-04-11 VITALS — BP 122/64 | HR 77 | Temp 97.6°F | Ht 68.5 in | Wt 248.0 lb

## 2020-04-11 DIAGNOSIS — E1149 Type 2 diabetes mellitus with other diabetic neurological complication: Secondary | ICD-10-CM

## 2020-04-11 DIAGNOSIS — G300 Alzheimer's disease with early onset: Secondary | ICD-10-CM

## 2020-04-11 DIAGNOSIS — R944 Abnormal results of kidney function studies: Secondary | ICD-10-CM

## 2020-04-11 DIAGNOSIS — F0280 Dementia in other diseases classified elsewhere without behavioral disturbance: Secondary | ICD-10-CM

## 2020-04-11 DIAGNOSIS — G4733 Obstructive sleep apnea (adult) (pediatric): Secondary | ICD-10-CM

## 2020-04-11 NOTE — Progress Notes (Signed)
Subjective:    Patient ID: Cole Harris, male    DOB: 03-Nov-1945, 74 y.o.   MRN: [id number]  HPI This is a 74 year old male who presents today for follow-up of chronic medical conditions.  He is accompanied by his wife who is also being seen as well as his son,  [name].  Dementia- last had virtual visit with neurology 01/2019. Per patient's wife and son, he has gotten more forgetful, inactive, wants to sit around and snack. Sleeping ok, no wandering or concern for dangerous behavior. He jokes and kids around more the more forgetful he gets. No real hobbies or activities. He swims in their 4 ft pool several days a week. Taking memantadine-doenepezil 28-10 mg daily.   Chronic neck and right shoulder pain- has not been bothering him much lately. Rare medication use for symptoms.   DM type 2- previously required insulin. Currently on metformin xr 500 mg po qd. Family follows keto style diet. Patient has lost over 30 pounds in the last year. Rarely checks home blood sugars.   OSA on CPAP- mask was changed a while back to full face from nasal pillow, doesn't stay on as well. Patient reports good sleep, feels rested.   Review of Systems Denies headache, visual change, chest pain, SOB, abdominal pain, diarrhea/ constipation, urinary frequency, nocturia, frequent muscle or joint pain.     Objective:   Physical Exam Vitals reviewed.  Constitutional:      General: He is not in acute distress.    Appearance: Normal appearance. He is obese. He is not ill-appearing, toxic-appearing or diaphoretic.  HENT:     Head: Normocephalic and atraumatic.  Eyes:     Conjunctiva/sclera: Conjunctivae normal.  Cardiovascular:     Rate and Rhythm: Normal rate and regular rhythm.     Heart sounds: Normal heart sounds.  Pulmonary:     Effort: Pulmonary effort is normal.     Breath sounds: Normal breath sounds.  Musculoskeletal:     Cervical back: Normal range of motion and neck supple.     Right lower leg:  No edema.     Left lower leg: No edema.  Skin:    General: Skin is warm and dry.  Neurological:     General: No focal deficit present.     Mental Status: He is alert. Mental status is at baseline.     Gait: Gait normal.  Psychiatric:        Mood and Affect: Mood normal.        Behavior: Behavior normal.       BP 122/64   Pulse 77   Temp 97.6 F (36.4 C)   Ht 5' 8.5" (1.74 m)   Wt 248 lb (112.5 kg)   SpO2 96%   BMI 37.16 kg/m  Wt Readings from Last 3 Encounters:  04/11/20 248 lb (112.5 kg)  10/03/19 256 lb 12.8 oz (116.5 kg)  05/23/19 280 lb 6.4 oz (127.2 kg)       Assessment & Plan:  1. Type 2 diabetes mellitus with other neurologic complication, without long-term current use of insulin (HCC) - has done well with weight loss, encouraged continued low carb diet - Hemoglobin A1c - Basic metabolic panel  2. Decreased GFR - Basic metabolic panel  3. OSA treated with BiPAP - suggested follow up with pulmonary to see if different mask could be ordered  4. Early onset Alzheimer's dementia without behavioral disturbance (HCC) - needs follow up with neurology, family will call to  schedule.  - encouraged regular exercise, as much socialization as possible with current situation  - follow up in 6 months  This visit occurred during the SARS-CoV-2 public health emergency.  Safety protocols were in place, including screening questions prior to the visit, additional usage of staff PPE, and extensive cleaning of exam room while observing appropriate contact time as indicated for disinfecting solutions.      Olean Ree, FNP-BC   Primary Care at [facility], [facility]  04/13/2020 9:00 PM

## 2020-04-11 NOTE — Patient Instructions (Signed)
Good to see you today  I will notify you of labs and send in potassium if needed

## 2020-04-12 LAB — BASIC METABOLIC PANEL
BUN: 17 mg/dL (ref 7–25)
CO2: 27 mmol/L (ref 20–32)
Calcium: 10 mg/dL (ref 8.6–10.3)
Chloride: 102 mmol/L (ref 98–110)
Creat: 1.05 mg/dL (ref 0.70–1.18)
Glucose, Bld: 134 mg/dL — ABNORMAL HIGH (ref 65–99)
Potassium: 4.2 mmol/L (ref 3.5–5.3)
Sodium: 137 mmol/L (ref 135–146)

## 2020-04-12 LAB — HEMOGLOBIN A1C
Hgb A1c MFr Bld: 6.3 % of total Hgb — ABNORMAL HIGH (ref <5.7)
Mean Plasma Glucose: 134 (calc)
eAG (mmol/L): 7.4 (calc)

## 2020-04-16 ENCOUNTER — Telehealth: Payer: Self-pay

## 2020-04-16 NOTE — Telephone Encounter (Signed)
Cole Harris called back and notified as instructed and she voiced understanding.

## 2020-04-16 NOTE — Telephone Encounter (Signed)
No need to test patient unless he develops symptoms in which case he should be tested immediately. If positive, should be referred for consideration of monoclonal antibody testing.

## 2020-04-16 NOTE — Telephone Encounter (Signed)
Cole Harris(DPR signed) left v/m that Cole Harris's son that lives with her has tested positive for covid with OTC covid testing supplies and her son got tested this morning for covid (not sure where). Cole Harris's son is isolating himself upstairs in his bedroom. Cole Harris's son only has a stuffy nose for covid symptoms. Cole Harris wants D Leone Payor FNP to know what is going on with pt who has dementia.Pt is not having covid symptoms. I left v/m to see if family members have had covid vaccines and when was Cole Harris's son dx with covid. Pt had pfizer  covid vaccine per immunization list on 10/14/19 and 11/06/19. FYI to Harlin Heys FNP and Ernestene Mention LPN

## 2020-04-18 ENCOUNTER — Other Ambulatory Visit: Payer: Self-pay

## 2020-04-18 DIAGNOSIS — Z794 Long term (current) use of insulin: Secondary | ICD-10-CM

## 2020-04-18 DIAGNOSIS — E1165 Type 2 diabetes mellitus with hyperglycemia: Secondary | ICD-10-CM

## 2020-04-18 MED ORDER — FUROSEMIDE 40 MG PO TABS: 40.0000 mg | ORAL_TABLET | Freq: Every day | ORAL | 0 refills | Status: DC

## 2020-04-18 MED ORDER — NIACIN ER (ANTIHYPERLIPIDEMIC) 500 MG PO TBCR: 500.0000 mg | EXTENDED_RELEASE_TABLET | Freq: Every day | ORAL | 0 refills | Status: DC

## 2020-04-18 MED ORDER — METFORMIN HCL ER 500 MG PO TB24: ORAL_TABLET | ORAL | 0 refills | Status: DC

## 2020-04-18 MED ORDER — OMEPRAZOLE 20 MG PO CPDR: 20.0000 mg | DELAYED_RELEASE_CAPSULE | Freq: Every day | ORAL | 0 refills | Status: DC

## 2020-04-18 MED ORDER — IRBESARTAN 300 MG PO TABS: 300.0000 mg | ORAL_TABLET | Freq: Every day | ORAL | 0 refills | Status: DC

## 2020-06-13 ENCOUNTER — Other Ambulatory Visit: Payer: Self-pay | Admitting: Family Medicine

## 2020-06-13 DIAGNOSIS — E1165 Type 2 diabetes mellitus with hyperglycemia: Secondary | ICD-10-CM

## 2020-06-13 DIAGNOSIS — Z794 Long term (current) use of insulin: Secondary | ICD-10-CM

## 2020-06-16 ENCOUNTER — Telehealth: Payer: Self-pay

## 2020-06-16 ENCOUNTER — Other Ambulatory Visit: Payer: Self-pay

## 2020-06-16 DIAGNOSIS — Z794 Long term (current) use of insulin: Secondary | ICD-10-CM

## 2020-06-16 DIAGNOSIS — E1165 Type 2 diabetes mellitus with hyperglycemia: Secondary | ICD-10-CM

## 2020-06-16 MED ORDER — FUROSEMIDE 40 MG PO TABS
40.0000 mg | ORAL_TABLET | Freq: Every day | ORAL | 0 refills | Status: DC
Start: 2020-06-16 — End: 2020-12-02

## 2020-06-16 MED ORDER — METFORMIN HCL ER 500 MG PO TB24: ORAL_TABLET | ORAL | 0 refills | Status: DC

## 2020-06-16 MED ORDER — IRBESARTAN 300 MG PO TABS
300.0000 mg | ORAL_TABLET | Freq: Every day | ORAL | 0 refills | Status: DC
Start: 2020-06-16 — End: 2021-12-02

## 2020-06-16 NOTE — Telephone Encounter (Signed)
Omeprazole and Niacin shows dc in May then refilled on Aug 8th.  Was the medication dc'd or do I need to refill?

## 2020-06-16 NOTE — Telephone Encounter (Signed)
Discussed with Clydie Braun, medication was filled 8/21 for 90 days with a refill, no refill is indicated at this time.

## 2020-06-25 ENCOUNTER — Other Ambulatory Visit: Payer: Self-pay | Admitting: *Deleted

## 2020-06-25 DIAGNOSIS — E1165 Type 2 diabetes mellitus with hyperglycemia: Secondary | ICD-10-CM

## 2020-06-25 NOTE — Telephone Encounter (Signed)
Bree at [facility] left a voicemail requesting refills for the patient. Wallace Cullens stated that they need refills on the following medications Irbesartan 300 mg Furosemide 40 mg Omeprazole 20 mg Niacin 500 Metformin XR 500 Bree stated if calling back use reference # [id number]

## 2020-06-27 NOTE — Telephone Encounter (Signed)
Please seen refill request.

## 2020-06-30 NOTE — Telephone Encounter (Signed)
Call [facility] and spoke with [facility]. Advised all medication request refill is too soon. Myrtis Ser will cancel refill request and contact pt.

## 2020-08-17 ENCOUNTER — Encounter: Payer: Self-pay | Admitting: Family Medicine

## 2020-08-17 DIAGNOSIS — G8929 Other chronic pain: Secondary | ICD-10-CM

## 2020-08-18 MED ORDER — HYDROCODONE-ACETAMINOPHEN 10-325 MG PO TABS: 1.0000 | ORAL_TABLET | Freq: Three times a day (TID) | ORAL | 0 refills | Status: DC | PRN

## 2020-08-18 NOTE — Telephone Encounter (Signed)
Last filled 11-07-19 #30 Last OV 04-11-20 Next OV was 10-13-20 for 6 month F/U [facility]

## 2020-10-13 ENCOUNTER — Ambulatory Visit: Payer: No Typology Code available for payment source | Admitting: Family Medicine

## 2020-10-28 ENCOUNTER — Telehealth: Payer: Self-pay

## 2020-10-28 DIAGNOSIS — E782 Mixed hyperlipidemia: Secondary | ICD-10-CM

## 2020-10-28 DIAGNOSIS — Z794 Long term (current) use of insulin: Secondary | ICD-10-CM

## 2020-10-28 DIAGNOSIS — E1165 Type 2 diabetes mellitus with hyperglycemia: Secondary | ICD-10-CM

## 2020-10-28 MED ORDER — GEMFIBROZIL 600 MG PO TABS: 600.0000 mg | ORAL_TABLET | Freq: Two times a day (BID) | ORAL | 2 refills | Status: DC

## 2020-10-28 MED ORDER — METFORMIN HCL ER 500 MG PO TB24: ORAL_TABLET | ORAL | 0 refills | Status: DC

## 2020-10-28 NOTE — Telephone Encounter (Signed)
Pharmacy requests refill on: Metformin HCL ER 500 mg   LAST REFILL: 06/16/2020 (Q-90, R-0) LAST OV: 04/11/2020 NEXT OV: 12/05/2020 PHARMACY: [id number] [place], [place]  Hgb A1C (04/11/2020): 6.3

## 2020-10-28 NOTE — Telephone Encounter (Signed)
Pharmacy requests refill on: Gemfibrozil 600 mg   LAST REFILL: 02/15/2020 (Q-60, R-5) LAST OV: 04/11/2020 NEXT OV: 12/05/2020 PHARMACY: [id number] [place], [place]

## 2020-11-24 ENCOUNTER — Telehealth: Payer: Self-pay

## 2020-11-24 MED ORDER — NIACIN ER (ANTIHYPERLIPIDEMIC) 500 MG PO TBCR: 500.0000 mg | EXTENDED_RELEASE_TABLET | Freq: Every day | ORAL | 0 refills | Status: DC

## 2020-11-24 NOTE — Telephone Encounter (Signed)
Pharmacy requests refill on: Niacin ER 500 mg   LAST REFILL: 04/18/2020 (Q-90, R-0) LAST OV: 04/11/2020 NEXT OV: 12/05/2020 PHARMACY: [id number] [place], [place]

## 2020-12-02 ENCOUNTER — Telehealth: Payer: Self-pay

## 2020-12-02 MED ORDER — FUROSEMIDE 40 MG PO TABS: 40.0000 mg | ORAL_TABLET | Freq: Every day | ORAL | 0 refills | Status: DC

## 2020-12-02 NOTE — Telephone Encounter (Signed)
Pharmacy requests refill on: Furosemide 40 mg   LAST REFILL: 06/16/2020 (Q-90, R-0) LAST OV: 04/11/2020 NEXT OV: 12/05/2020 PHARMACY: [id number] [place], [place]

## 2020-12-05 ENCOUNTER — Ambulatory Visit (INDEPENDENT_AMBULATORY_CARE_PROVIDER_SITE_OTHER): Payer: No Typology Code available for payment source | Admitting: Family Medicine

## 2020-12-05 ENCOUNTER — Encounter: Payer: Self-pay | Admitting: Family Medicine

## 2020-12-05 ENCOUNTER — Other Ambulatory Visit: Payer: Self-pay

## 2020-12-05 VITALS — BP 130/80 | HR 71 | Temp 97.7°F | Ht 68.5 in | Wt 254.4 lb

## 2020-12-05 DIAGNOSIS — Z794 Long term (current) use of insulin: Secondary | ICD-10-CM | POA: Diagnosis not present

## 2020-12-05 DIAGNOSIS — G300 Alzheimer's disease with early onset: Secondary | ICD-10-CM | POA: Diagnosis not present

## 2020-12-05 DIAGNOSIS — E1165 Type 2 diabetes mellitus with hyperglycemia: Secondary | ICD-10-CM | POA: Diagnosis not present

## 2020-12-05 DIAGNOSIS — K219 Gastro-esophageal reflux disease without esophagitis: Secondary | ICD-10-CM

## 2020-12-05 DIAGNOSIS — E039 Hypothyroidism, unspecified: Secondary | ICD-10-CM

## 2020-12-05 DIAGNOSIS — E785 Hyperlipidemia, unspecified: Secondary | ICD-10-CM

## 2020-12-05 DIAGNOSIS — E118 Type 2 diabetes mellitus with unspecified complications: Secondary | ICD-10-CM

## 2020-12-05 DIAGNOSIS — F0280 Dementia in other diseases classified elsewhere without behavioral disturbance: Secondary | ICD-10-CM

## 2020-12-05 DIAGNOSIS — E1169 Type 2 diabetes mellitus with other specified complication: Secondary | ICD-10-CM

## 2020-12-05 DIAGNOSIS — E538 Deficiency of other specified B group vitamins: Secondary | ICD-10-CM

## 2020-12-05 LAB — POCT GLYCOSYLATED HEMOGLOBIN (HGB A1C): Hemoglobin A1C: 6.4 % — AB (ref 4.0–5.6)

## 2020-12-05 NOTE — Assessment & Plan Note (Signed)
Update b12 level. Currently on daily.

## 2020-12-05 NOTE — Progress Notes (Signed)
Patient ID: Cole Harris, male    DOB: 1946/03/07, 75 y.o.   MRN: [id number]  This visit was conducted in person.  BP 130/80   Pulse 71   Temp 97.7 F (36.5 C) (Temporal)   Ht 5' 8.5" (1.74 m)   Wt 254 lb 6 oz (115.4 kg)   SpO2 96%   BMI 38.12 kg/m    CC: transfer care Subjective:   HPI: Cole Harris is a 75 y.o. male presenting on 12/05/2020 for Transitions Of Care (TOC from [facility].  Pt accompanied by daughter-in-law, Clydie Braun. )   Moved to area 4 yrs ago from [place], to live closer to son and DIL.  H/o back surgery and neck surgery - sparing hydrocodone use.   HLD - on atorvastatin, gemfibrozil and niacin. They would like to transition to crestor.   DM - well controlled on metformin XR 500mg   Lab Results  Component Value Date   HGBA1C 6.4 (A) 12/05/2020    HTN - on irbesartan   Depression - on wellbutrin XL 150mg  and lexapro 20mg  daily.   GERD - on omeprazole 20mg  daily.   Dementia - on namenda/aricept combo pill. Progressively worsening even over several weeks. Note trouble remembering words for objects. Seeing 02/04/2021 neurology regularly ). Ok to continue melatonin. Consider low dose depakote for sundowning.      Relevant past medical, surgical, family and social history reviewed and updated as indicated. Interim medical history since our last visit reviewed. Allergies and medications reviewed and updated. Outpatient Medications Prior to Visit  Medication Sig Dispense Refill  . atorvastatin (LIPITOR) 20 MG tablet TAKE 1 TABLET DAILY 90 tablet 0  . buPROPion (WELLBUTRIN XL) 150 MG 24 hr tablet TAKE 1 TABLET DAILY 90 tablet 3  . Cholecalciferol (VITAMIN D3) 50000 units TABS Take 50,000 Units by mouth once a week.    . escitalopram (LEXAPRO) 20 MG tablet TAKE 1 TABLET DAILY 90 tablet 3  . furosemide (LASIX) 40 MG tablet Take 1 tablet (40 mg total) by mouth daily. 90 tablet 0  . gemfibrozil (LOPID) 600 MG tablet Take 1 tablet (600 mg total) by mouth 2  (two) times daily before a meal. 60 tablet 2  . HYDROcodone-acetaminophen (NORCO) 10-325 MG tablet Take 1-2 tablets by mouth every 8 (eight) hours as needed for severe pain. 30 tablet 0  . irbesartan (AVAPRO) 300 MG tablet Take 1 tablet (300 mg total) by mouth daily. 90 tablet 0  . Lido-Capsaicin-Men-Methyl Sal (1ST MEDX-PATCH/ LIDOCAINE) 4-0.374-01-16 % PTCH Apply 1 patch topically daily. (Patient taking differently: Apply 1 patch topically daily. As needed.) 30 patch 5  . meclizine (ANTIVERT) 25 MG tablet TAKE 1 TABLET 3 TIMES DAILYAS NEEDED FOR DIZZINESS OR NAUSEA 90 tablet 0  . Memantine HCl-Donepezil HCl 28-10 MG CP24 Take 1 capsule by mouth daily.    . metFORMIN (GLUCOPHAGE-XR) 500 MG 24 hr tablet TAKE 1 TABLET BY MOUTH EVERY DAY WITH BREAKFAST 90 tablet 0  . naproxen sodium (ALEVE) 220 MG tablet Take 220 mg by mouth.    . niacin (NIASPAN) 500 MG CR tablet Take 1 tablet (500 mg total) by mouth at bedtime. 90 tablet 0  . omeprazole (PRILOSEC) 20 MG capsule Take 1 capsule (20 mg total) by mouth daily. 90 capsule 0  . vitamin B-12 (CYANOCOBALAMIN) 1000 MCG tablet Take by mouth.    KLOR-CON M20 20 MEQ tablet TAKE 1 TABLET DAILY 90 tablet 0   No facility-administered medications prior to visit.  Per HPI unless specifically indicated in ROS section below Review of Systems Objective:  BP 130/80   Pulse 71   Temp 97.7 F (36.5 C) (Temporal)   Ht 5' 8.5" (1.74 m)   Wt 254 lb 6 oz (115.4 kg)   SpO2 96%   BMI 38.12 kg/m   Wt Readings from Last 3 Encounters:  12/05/20 254 lb 6 oz (115.4 kg)  04/11/20 248 lb (112.5 kg)  10/03/19 256 lb 12.8 oz (116.5 kg)      Physical Exam Vitals and nursing note reviewed.  Constitutional:      Appearance: Normal appearance. He is not ill-appearing.  Cardiovascular:     Rate and Rhythm: Normal rate and regular rhythm.     Pulses: Normal pulses.     Heart sounds: Normal heart sounds. No murmur heard.   Pulmonary:     Effort: Pulmonary effort  is normal. No respiratory distress.     Breath sounds: Normal breath sounds. No wheezing, rhonchi or rales.  Musculoskeletal:     Right lower leg: No edema.     Left lower leg: No edema.  Skin:    General: Skin is warm and dry.     Findings: No erythema or rash.  Neurological:     Mental Status: He is alert.     Comments: Repetitive jaw grinding present       Results for orders placed or performed in visit on 12/05/20  POCT glycosylated hemoglobin (Hb A1C)  Result Value Ref Range   Hemoglobin A1C 6.4 (A) 4.0 - 5.6 %   HbA1c POC (<> result, manual entry)     HbA1c, POC (prediabetic range)     HbA1c, POC (controlled diabetic range)     Assessment & Plan:  This visit occurred during the SARS-CoV-2 public health emergency.  Safety protocols were in place, including screening questions prior to the visit, additional usage of staff PPE, and extensive cleaning of exam room while observing appropriate contact time as indicated for disinfecting solutions.   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 Family notes recent progression. On melatonin nightly for sleep. Considering depakote for sundowning.       Hypothyroidism (Chronic)    Update levels off replacement.       Relevant Orders   TSH   Dyslipidemia associated with type 2 diabetes mellitus (HCC)    Wife interested in changing from atorvastatin to rosuvastatin. I will check FLP today and then consider transition to more potent crestor, and trial off gemfibrozil and niacin.  The 10-year ASCVD risk score Denman George DC Montez Hageman., et al., 2013) is: 39.6%   Values used to calculate the score:     Age: 62 years     Sex: Male     Is Non-Hispanic African American: No     Diabetic: Yes     Tobacco smoker: No     Systolic Blood Pressure: 130 mmHg     Is BP treated: No     HDL  Cholesterol: 34.6 mg/dL     Total Cholesterol: 150 mg/dL       Relevant Orders   Lipid panel   Comprehensive metabolic panel   Vitamin B12 deficiency    Update b12 level. Currently on daily.       Relevant Orders   Vitamin B12   Controlled diabetes mellitus type  2 with complications (HCC) - Primary    Chronic, stable period on once daily metformin XR.       Relevant Orders   POCT glycosylated hemoglobin (Hb A1C) (Completed)   GERD (gastroesophageal reflux disease)    Stable period on once daily omeprazole 20mg            No orders of the defined types were placed in this encounter.  Orders Placed This Encounter  Procedures  . Lipid panel  . Comprehensive metabolic panel  . TSH  . Vitamin B12  . POCT glycosylated hemoglobin (Hb A1C)    Patient Instructions  Labs today  Schedule wellness visit in 3-4 months  No med changes for now but we will be in touch with lab results - may transition off lipitor onto crestor.    Follow up plan: Return in about 3 months (around 03/06/2021) for annual exam, prior fasting for blood work, medicare wellness visit.  05/07/2021, MD

## 2020-12-05 NOTE — Assessment & Plan Note (Signed)
Wife interested in changing from atorvastatin to rosuvastatin. I will check FLP today and then consider transition to more potent crestor, and trial off gemfibrozil and niacin.  The 10-year ASCVD risk score Denman George DC Montez Hageman., et al., 2013) is: 39.6%   Values used to calculate the score:     Age: 75 years     Sex: Male     Is Non-Hispanic African American: No     Diabetic: Yes     Tobacco smoker: No     Systolic Blood Pressure: 130 mmHg     Is BP treated: No     HDL Cholesterol: 34.6 mg/dL     Total Cholesterol: 150 mg/dL

## 2020-12-05 NOTE — Patient Instructions (Addendum)
Labs today  Schedule wellness visit in 3-4 months  No med changes for now but we will be in touch with lab results - may transition off lipitor onto crestor.

## 2020-12-05 NOTE — Assessment & Plan Note (Signed)
Stable period on once daily omeprazole 20mg 

## 2020-12-05 NOTE — Assessment & Plan Note (Addendum)
Known dementia ?Alz followed by [facility]) on namenda and aricept combination medication. Family notes recent progression. On melatonin nightly for sleep. Considering depakote for sundowning.

## 2020-12-05 NOTE — Assessment & Plan Note (Signed)
Update levels off replacement. 

## 2020-12-05 NOTE — Assessment & Plan Note (Signed)
Chronic, stable period on once daily metformin XR.

## 2020-12-06 LAB — COMPREHENSIVE METABOLIC PANEL
AG Ratio: 1.3 (calc) (ref 1.0–2.5)
ALT: 12 U/L (ref 9–46)
AST: 16 U/L (ref 10–35)
Albumin: 4.4 g/dL (ref 3.6–5.1)
Alkaline phosphatase (APISO): 51 U/L (ref 35–144)
BUN: 14 mg/dL (ref 7–25)
CO2: 22 mmol/L (ref 20–32)
Calcium: 10 mg/dL (ref 8.6–10.3)
Chloride: 105 mmol/L (ref 98–110)
Creat: 0.97 mg/dL (ref 0.70–1.18)
Globulin: 3.3 g/dL (calc) (ref 1.9–3.7)
Glucose, Bld: 124 mg/dL — ABNORMAL HIGH (ref 65–99)
Potassium: 4.8 mmol/L (ref 3.5–5.3)
Sodium: 146 mmol/L (ref 135–146)
Total Bilirubin: 0.4 mg/dL (ref 0.2–1.2)
Total Protein: 7.7 g/dL (ref 6.1–8.1)

## 2020-12-06 LAB — LIPID PANEL
Cholesterol: 228 mg/dL — ABNORMAL HIGH (ref <200)
HDL: 40 mg/dL (ref >=40)
LDL Cholesterol (Calc): 155 mg/dL (calc) — ABNORMAL HIGH
Non-HDL Cholesterol (Calc): 188 mg/dL (calc) — ABNORMAL HIGH (ref <130)
Total CHOL/HDL Ratio: 5.7 (calc) — ABNORMAL HIGH (ref <5.0)
Triglycerides: 192 mg/dL — ABNORMAL HIGH (ref <150)

## 2020-12-06 LAB — TSH: TSH: 1.49 mIU/L (ref 0.40–4.50)

## 2020-12-06 LAB — VITAMIN B12: Vitamin B-12: 1011 pg/mL (ref 200–1100)

## 2020-12-12 ENCOUNTER — Other Ambulatory Visit: Payer: Self-pay | Admitting: Family Medicine

## 2020-12-12 MED ORDER — ROSUVASTATIN CALCIUM 20 MG PO TABS
20.0000 mg | ORAL_TABLET | Freq: Every day | ORAL | 3 refills | Status: DC
Start: 2020-12-12 — End: 2021-07-29

## 2021-02-05 ENCOUNTER — Other Ambulatory Visit: Payer: Self-pay

## 2021-02-05 MED ORDER — OMEPRAZOLE 20 MG PO CPDR: 20.0000 mg | DELAYED_RELEASE_CAPSULE | Freq: Every day | ORAL | 0 refills | Status: DC

## 2021-02-05 NOTE — Telephone Encounter (Signed)
E-scribed refill 

## 2021-02-10 ENCOUNTER — Other Ambulatory Visit: Payer: Self-pay | Admitting: Family Medicine

## 2021-02-10 DIAGNOSIS — E782 Mixed hyperlipidemia: Secondary | ICD-10-CM

## 2021-02-11 NOTE — Telephone Encounter (Signed)
Do recommend only take crestor 20mg  for cholesterol, ok to stay off niacin and gemfibrozil. We will need to recheck lipid levels in 2-3 months to monitor effect.  I do recommend they restart lexapro 20mg  - looks like Dr sent this in for them.  Keep updated with how he's doing.

## 2021-02-11 NOTE — Telephone Encounter (Signed)
Lvm asking pt to call back.  Need to know if pt has restarted gemfibrozil (Lopid) 600 mg tab.    Per 12/05/20 OV note, pt was to do a trial off this medication.

## 2021-02-11 NOTE — Addendum Note (Signed)
Addended by: Eustaquio Boyden on: 02/11/2021 05:20 PM   Modules accepted: Orders

## 2021-02-11 NOTE — Telephone Encounter (Signed)
Cole Harris (DPR signed) Cole Harris said that pt is not presently taking the gemfibrozil; Cole Harris said the refill request could be an auto refill request but pt is not taking gemfibrozil. Pt saw Dr Malvin Johns neurologist affiliated with Kateri Mc at [facility] and Cole Harris said the neurologist wanted to know if pt needs to be on crestor and niacin. Cole Harris said neurologist and Cole Harris will leave up to Dr Reece Agar also if pt should continue taking Lexapro 20 mg taking one tablet daily to see if will help with pts's mood. Pt has been off lexapro for about 30 days due to pt being without med. Last refilled Lexapro 20 mg # 90 x 3 on 08/28/19 but Cole Harris said pt had extra residual med (Lexapro) at home and pt just ran out about one month ago. Cole Harris is not sure if Lexapro was helping pt or not; Cole Harris said last 2 wks pt has been more argumentative and at times more confused. Dr Malvin Johns neurologist started pt on Depakote couple of wks ago and just increased depakote to 250 mg pill bid.Cole Harris is not sure if Depokate is helping (just increased on 02/10/21) but Cole Harris is sure dementia is worsening. Pt lives with Cole Harris and her husband. Cole Harris request cb after reviewed by Dr Reece Agar. [facility]. Dustin CMA will let [name] CMA to let DrG be aware of this note.

## 2021-02-11 NOTE — Telephone Encounter (Signed)
Noted.  Denied gemfibrozil refill.   Fwd to Dr. Reece Agar to address remaining part of message.

## 2021-02-12 NOTE — Telephone Encounter (Signed)
Spoke with pt's daughter, Clydie Braun (on dpr), relaying Dr. Timoteo Expose message.  She verbalizes understanding.  She will call back to schedule 2-3 mo lab visit and possibly r/s 03/17/21 CPE due to traveling plans.

## 2021-02-21 ENCOUNTER — Other Ambulatory Visit: Payer: Self-pay | Admitting: Family Medicine

## 2021-02-24 ENCOUNTER — Other Ambulatory Visit: Payer: Self-pay | Admitting: Family Medicine

## 2021-02-25 NOTE — Telephone Encounter (Signed)
Discontinued.  (see Refill, 02/10/21)

## 2021-03-07 ENCOUNTER — Other Ambulatory Visit: Payer: Self-pay | Admitting: Family Medicine

## 2021-03-07 DIAGNOSIS — E785 Hyperlipidemia, unspecified: Secondary | ICD-10-CM

## 2021-03-07 DIAGNOSIS — E039 Hypothyroidism, unspecified: Secondary | ICD-10-CM

## 2021-03-07 DIAGNOSIS — E1169 Type 2 diabetes mellitus with other specified complication: Secondary | ICD-10-CM

## 2021-03-07 DIAGNOSIS — E118 Type 2 diabetes mellitus with unspecified complications: Secondary | ICD-10-CM

## 2021-03-13 ENCOUNTER — Other Ambulatory Visit: Payer: No Typology Code available for payment source

## 2021-03-13 ENCOUNTER — Ambulatory Visit: Payer: No Typology Code available for payment source

## 2021-03-15 ENCOUNTER — Other Ambulatory Visit: Payer: Self-pay | Admitting: Family Medicine

## 2021-03-15 DIAGNOSIS — E782 Mixed hyperlipidemia: Secondary | ICD-10-CM

## 2021-03-17 ENCOUNTER — Ambulatory Visit: Payer: No Typology Code available for payment source | Admitting: Family Medicine

## 2021-05-02 ENCOUNTER — Other Ambulatory Visit: Payer: Self-pay | Admitting: Family Medicine

## 2021-05-02 DIAGNOSIS — E1165 Type 2 diabetes mellitus with hyperglycemia: Secondary | ICD-10-CM

## 2021-05-02 DIAGNOSIS — Z794 Long term (current) use of insulin: Secondary | ICD-10-CM

## 2021-05-05 NOTE — Telephone Encounter (Signed)
E-scribed refill.  Plz schedule wellness, lab and cpe visits.  

## 2021-05-27 ENCOUNTER — Other Ambulatory Visit: Payer: Self-pay | Admitting: Family Medicine

## 2021-06-16 ENCOUNTER — Telehealth: Payer: Self-pay | Admitting: Family Medicine

## 2021-06-16 NOTE — Telephone Encounter (Signed)
Received notice that patient had sugar in urine when checked at neurology office. Plz call to offer OV for further evaluation of sugar in urine.

## 2021-06-17 NOTE — Telephone Encounter (Signed)
Pt daughter returning call 

## 2021-06-17 NOTE — Telephone Encounter (Addendum)
Spoke with pt's daughter, Clydie Braun (on dpr) relaying Dr. Timoteo Expose message.  She verbalizes understanding and requests visit at [facility] office.  Schedule OV on 06/22/21 at 3:30 with Dr. Alphonsus Sias.  FYI to Dr. Reece Agar.

## 2021-06-17 NOTE — Telephone Encounter (Signed)
Lvm asking pt to call back.  Need to relay Dr. G's message and schedule OV accordingly.  

## 2021-06-22 ENCOUNTER — Other Ambulatory Visit: Payer: Self-pay

## 2021-06-22 ENCOUNTER — Encounter: Payer: Self-pay | Admitting: Internal Medicine

## 2021-06-22 ENCOUNTER — Ambulatory Visit (INDEPENDENT_AMBULATORY_CARE_PROVIDER_SITE_OTHER): Payer: Medicare Other | Admitting: Internal Medicine

## 2021-06-22 VITALS — BP 120/78 | HR 68 | Temp 97.2°F | Ht 69.0 in | Wt 260.0 lb

## 2021-06-22 DIAGNOSIS — Z23 Encounter for immunization: Secondary | ICD-10-CM

## 2021-06-22 DIAGNOSIS — F0280 Dementia in other diseases classified elsewhere without behavioral disturbance: Secondary | ICD-10-CM

## 2021-06-22 DIAGNOSIS — G300 Alzheimer's disease with early onset: Secondary | ICD-10-CM | POA: Diagnosis not present

## 2021-06-22 DIAGNOSIS — R81 Glycosuria: Secondary | ICD-10-CM

## 2021-06-22 DIAGNOSIS — E118 Type 2 diabetes mellitus with unspecified complications: Secondary | ICD-10-CM | POA: Diagnosis not present

## 2021-06-22 LAB — POCT GLYCOSYLATED HEMOGLOBIN (HGB A1C): Hemoglobin A1C: 7 % — AB (ref 4.0–5.6)

## 2021-06-22 NOTE — Assessment & Plan Note (Signed)
Lab Results  Component Value Date   HGBA1C 7.0 (A) 06/22/2021   Still acceptable on just metformin but has worsened Discussed trying to limit carbs a little more again

## 2021-06-22 NOTE — Assessment & Plan Note (Addendum)
Likely related to eating before the urinalysis and exceeding the renal threshold Lab Results  Component Value Date   HGBA1C 7.0 (A) 06/22/2021   Increasing incontinence is likely related to his worsening dementia

## 2021-06-22 NOTE — Addendum Note (Signed)
Addended by: Eual Fines on: 06/22/2021 04:17 PM   Modules accepted: Orders

## 2021-06-22 NOTE — Progress Notes (Signed)
Subjective:    Patient ID: Cole Harris, male    DOB: Jan 25, 1946, 75 y.o.   MRN: [id number]  HPI Here with son due to glycosuria This visit occurred during the SARS-CoV-2 public health emergency.  Safety protocols were in place, including screening questions prior to the visit, additional usage of staff PPE, and extensive cleaning of exam room while observing appropriate contact time as indicated for disinfecting solutions.    Was at neurologist --having some night incontinence--so he had him do a urinalysis. This showed sugar but no signs of infection (done due to increased irritability) Having some memory issues---dementia--that are worsening Lives with son Son helps get him up---helps him with cleaning/showering No instrumental ADLs  Walks independently ---no falls (unless he falls asleep in the chair)  Has had some visual hallucinations Sees his reflection in glassand think someone is outside Sees people outside--no distress  Current Outpatient Medications on File Prior to Visit  Medication Sig Dispense Refill   buPROPion (WELLBUTRIN XL) 150 MG 24 hr tablet TAKE 1 TABLET DAILY 90 tablet 3   Cholecalciferol (VITAMIN D3) 50000 units TABS Take 50,000 Units by mouth once a week.     escitalopram (LEXAPRO) 20 MG tablet TAKE 1 TABLET DAILY 90 tablet 3   furosemide (LASIX) 40 MG tablet TAKE 1 TABLET BY MOUTH EVERY DAY 90 tablet 0   HYDROcodone-acetaminophen (NORCO) 10-325 MG tablet Take 1-2 tablets by mouth every 8 (eight) hours as needed for severe pain. 30 tablet 0   irbesartan (AVAPRO) 300 MG tablet Take 1 tablet (300 mg total) by mouth daily. 90 tablet 0   Lido-Capsaicin-Men-Methyl Sal (1ST MEDX-PATCH/ LIDOCAINE) 4-0.374-01-16 % PTCH Apply 1 patch topically daily. (Patient taking differently: Apply 1 patch topically daily. As needed.) 30 patch 5   meclizine (ANTIVERT) 25 MG tablet TAKE 1 TABLET 3 TIMES DAILYAS NEEDED FOR DIZZINESS OR NAUSEA 90 tablet 0   Memantine HCl-Donepezil  HCl 28-10 MG CP24 Take 1 capsule by mouth daily.     metFORMIN (GLUCOPHAGE-XR) 500 MG 24 hr tablet TAKE 1 TABLET BY MOUTH EVERY DAY WITH BREAKFAST 90 tablet 0   naproxen sodium (ALEVE) 220 MG tablet Take 220 mg by mouth.     omeprazole (PRILOSEC) 20 MG capsule Take 1 capsule (20 mg total) by mouth daily. 90 capsule 0   risperiDONE (RISPERDAL) 0.25 MG tablet Take 1 tab at night for two weeks then increase to 2 tabs at night and continue that dose     rosuvastatin (CRESTOR) 20 MG tablet Take 1 tablet (20 mg total) by mouth daily. 90 tablet 3   vitamin B-12 (CYANOCOBALAMIN) 1000 MCG tablet Take by mouth.     divalproex (DEPAKOTE) 125 MG DR tablet Take by mouth. 1 in am 2 at bedtime     No current facility-administered medications on file prior to visit.    No Known Allergies  Past Medical History:  Diagnosis Date   Alzheimer's disease with early onset (HCC) 08/24/2017   Arthritis    CAD (coronary artery disease) 08/24/2017   Depression    Diabetes mellitus without complication (HCC)    GERD (gastroesophageal reflux disease)    Hyperlipidemia    Hypertension    Pulmonary hypertension (HCC) 08/24/2017   Vertebrobasilar insufficiency 08/24/2017    Past Surgical History:  Procedure Laterality Date   BACK SURGERY  1998   INTERNAL TENS UNIT  2002   TONSILLECTOMY  1955    Family History  Problem Relation Age of Onset   Arthritis  Mother    Arthritis Father    Hearing loss Father    Heart disease Father    Hyperlipidemia Father     Social History   Socioeconomic History   Marital status: Married    Spouse name: Not on file   Number of children: Not on file   Years of education: Not on file   Highest education level: Not on file  Occupational History   Not on file  Tobacco Use   Smoking status: Former   Smokeless tobacco: Never  Vaping Use   Vaping Use: Never used  Substance and Sexual Activity   Alcohol use: No   Drug use: No   Sexual activity: Never    Birth  control/protection: Cervical cap  Other Topics Concern   Not on file  Social History Narrative   Not on file   Social Determinants of Health   Financial Resource Strain: Not on file  Food Insecurity: Not on file  Transportation Needs: Not on file  Physical Activity: Not on file  Stress: Not on file  Social Connections: Not on file  Intimate Partner Violence: Not on file   Review of Systems Appetite is excellent Weight is going up Ongoing mood problems---argues and won't listen to his wife     Objective:   Physical Exam Constitutional:      Comments: Somewhat dischelved  Neurological:     Mental Status: He is alert.  Psychiatric:     Comments: Mildly inappropriate interaction but not depressed, angry, etc           Assessment & Plan:

## 2021-06-22 NOTE — Assessment & Plan Note (Signed)
Some mood issues as well Not clear if perhaps he can get off the risperidone

## 2021-06-24 ENCOUNTER — Ambulatory Visit: Payer: No Typology Code available for payment source | Admitting: Family Medicine

## 2021-06-29 ENCOUNTER — Inpatient Hospital Stay (HOSPITAL_COMMUNITY)
Admission: EM | Admit: 2021-06-29 | Discharge: 2021-07-04 | DRG: 871 | Disposition: A | Discharge status: Home Health | Payer: Medicare Other | Attending: Internal Medicine | Admitting: Internal Medicine

## 2021-06-29 DIAGNOSIS — I272 Pulmonary hypertension, unspecified: Secondary | ICD-10-CM | POA: Diagnosis present

## 2021-06-29 DIAGNOSIS — K219 Gastro-esophageal reflux disease without esophagitis: Secondary | ICD-10-CM | POA: Diagnosis present

## 2021-06-29 DIAGNOSIS — F0280 Dementia in other diseases classified elsewhere without behavioral disturbance: Secondary | ICD-10-CM | POA: Diagnosis present

## 2021-06-29 DIAGNOSIS — G300 Alzheimer's disease with early onset: Secondary | ICD-10-CM | POA: Diagnosis present

## 2021-06-29 DIAGNOSIS — E039 Hypothyroidism, unspecified: Secondary | ICD-10-CM | POA: Diagnosis present

## 2021-06-29 DIAGNOSIS — G309 Alzheimer's disease, unspecified: Secondary | ICD-10-CM | POA: Diagnosis present

## 2021-06-29 DIAGNOSIS — I10 Essential (primary) hypertension: Secondary | ICD-10-CM | POA: Diagnosis present

## 2021-06-29 DIAGNOSIS — Z6839 Body mass index (BMI) 39.0-39.9, adult: Secondary | ICD-10-CM

## 2021-06-29 DIAGNOSIS — G9341 Metabolic encephalopathy: Secondary | ICD-10-CM | POA: Diagnosis present

## 2021-06-29 DIAGNOSIS — N390 Urinary tract infection, site not specified: Secondary | ICD-10-CM | POA: Diagnosis present

## 2021-06-29 DIAGNOSIS — Z8249 Family history of ischemic heart disease and other diseases of the circulatory system: Secondary | ICD-10-CM

## 2021-06-29 DIAGNOSIS — N200 Calculus of kidney: Secondary | ICD-10-CM | POA: Diagnosis present

## 2021-06-29 DIAGNOSIS — Z8261 Family history of arthritis: Secondary | ICD-10-CM

## 2021-06-29 DIAGNOSIS — E1169 Type 2 diabetes mellitus with other specified complication: Secondary | ICD-10-CM | POA: Diagnosis present

## 2021-06-29 DIAGNOSIS — A4151 Sepsis due to Escherichia coli [E. coli]: Secondary | ICD-10-CM | POA: Diagnosis not present

## 2021-06-29 DIAGNOSIS — G4733 Obstructive sleep apnea (adult) (pediatric): Secondary | ICD-10-CM | POA: Diagnosis present

## 2021-06-29 DIAGNOSIS — A419 Sepsis, unspecified organism: Secondary | ICD-10-CM | POA: Diagnosis not present

## 2021-06-29 DIAGNOSIS — I2510 Atherosclerotic heart disease of native coronary artery without angina pectoris: Secondary | ICD-10-CM | POA: Diagnosis present

## 2021-06-29 DIAGNOSIS — E1165 Type 2 diabetes mellitus with hyperglycemia: Secondary | ICD-10-CM | POA: Diagnosis present

## 2021-06-29 DIAGNOSIS — R6520 Severe sepsis without septic shock: Secondary | ICD-10-CM | POA: Diagnosis present

## 2021-06-29 DIAGNOSIS — Z87891 Personal history of nicotine dependence: Secondary | ICD-10-CM

## 2021-06-29 DIAGNOSIS — E872 Acidosis, unspecified: Secondary | ICD-10-CM | POA: Diagnosis present

## 2021-06-29 DIAGNOSIS — Z83438 Family history of other disorder of lipoprotein metabolism and other lipidemia: Secondary | ICD-10-CM

## 2021-06-29 DIAGNOSIS — Z20822 Contact with and (suspected) exposure to covid-19: Secondary | ICD-10-CM | POA: Diagnosis present

## 2021-06-29 DIAGNOSIS — E785 Hyperlipidemia, unspecified: Secondary | ICD-10-CM | POA: Diagnosis present

## 2021-06-29 DIAGNOSIS — Z7984 Long term (current) use of oral hypoglycemic drugs: Secondary | ICD-10-CM

## 2021-06-29 DIAGNOSIS — E669 Obesity, unspecified: Secondary | ICD-10-CM | POA: Diagnosis present

## 2021-06-29 NOTE — ED Triage Notes (Signed)
Patient here with increased blood in his urine.  Patient is demented, here with son.  Patient with increased agitation.  He was seen at [facility] clinic last week for same.

## 2021-06-29 NOTE — ED Provider Notes (Signed)
Emergency Medicine Provider Triage Evaluation Note  Cole Harris , a 75 y.o. male  was evaluated in triage.  Pt complains of hematuria.  Patient's son reports gross hematuria and brown urine, onset today.  The patient has also been endorsing right lower abdominal pain.  No history of UTIs or kidney stones.  In the ED, patient is refusing exam.  Patient is asking his son to leave the room.    When I asked if I could examine him, the patient hocked a loogie and spit on my foot.  I inquired for a second time if I could examine him, he states "oh one wasn't enough. Do you want me to spit on the other foot."  Level 5 caveat secondary to Alzheimer's disease.  Review of Systems  Positive: Hematuria, abdominal pain Negative: Fever  Physical Exam  There were no vitals taken for this visit. Gen:   Awake, agitated Resp:  Normal effort  MSK:   Moves extremities without difficulty  Other:  Patient is actively spitting on staff  Medical Decision Making  Medically screening exam initiated at 11:50 PM.  Appropriate orders placed.  Cole Harris was informed that the remainder of the evaluation will be completed by another provider, this initial triage assessment does not replace that evaluation, and the importance of remaining in the ED until their evaluation is complete.  Labs and imaging have been ordered.  Unfortunately, MSE is limited secondary to patient cooperation.  He will require further work-up and evaluation in the emergency department.   Barkley Boards, PA-C 06/29/21 2354    Shon Baton, MD 06/30/21 [phone]

## 2021-06-30 ENCOUNTER — Inpatient Hospital Stay (HOSPITAL_COMMUNITY): Payer: Medicare Other

## 2021-06-30 ENCOUNTER — Encounter (HOSPITAL_COMMUNITY): Payer: Self-pay | Admitting: Emergency Medicine

## 2021-06-30 ENCOUNTER — Telehealth: Payer: Self-pay | Admitting: *Deleted

## 2021-06-30 ENCOUNTER — Emergency Department (HOSPITAL_COMMUNITY): Payer: Medicare Other

## 2021-06-30 ENCOUNTER — Other Ambulatory Visit: Payer: Self-pay

## 2021-06-30 DIAGNOSIS — G9340 Encephalopathy, unspecified: Secondary | ICD-10-CM | POA: Diagnosis not present

## 2021-06-30 DIAGNOSIS — I272 Pulmonary hypertension, unspecified: Secondary | ICD-10-CM | POA: Diagnosis present

## 2021-06-30 DIAGNOSIS — F0280 Dementia in other diseases classified elsewhere without behavioral disturbance: Secondary | ICD-10-CM | POA: Diagnosis present

## 2021-06-30 DIAGNOSIS — I10 Essential (primary) hypertension: Secondary | ICD-10-CM | POA: Diagnosis present

## 2021-06-30 DIAGNOSIS — G309 Alzheimer's disease, unspecified: Secondary | ICD-10-CM | POA: Diagnosis present

## 2021-06-30 DIAGNOSIS — R6520 Severe sepsis without septic shock: Secondary | ICD-10-CM | POA: Diagnosis present

## 2021-06-30 DIAGNOSIS — E039 Hypothyroidism, unspecified: Secondary | ICD-10-CM | POA: Diagnosis present

## 2021-06-30 DIAGNOSIS — I2510 Atherosclerotic heart disease of native coronary artery without angina pectoris: Secondary | ICD-10-CM | POA: Diagnosis present

## 2021-06-30 DIAGNOSIS — E1165 Type 2 diabetes mellitus with hyperglycemia: Secondary | ICD-10-CM | POA: Diagnosis present

## 2021-06-30 DIAGNOSIS — E785 Hyperlipidemia, unspecified: Secondary | ICD-10-CM | POA: Diagnosis present

## 2021-06-30 DIAGNOSIS — A4151 Sepsis due to Escherichia coli [E. coli]: Secondary | ICD-10-CM | POA: Diagnosis present

## 2021-06-30 DIAGNOSIS — A419 Sepsis, unspecified organism: Secondary | ICD-10-CM | POA: Diagnosis present

## 2021-06-30 DIAGNOSIS — G9341 Metabolic encephalopathy: Secondary | ICD-10-CM | POA: Diagnosis present

## 2021-06-30 DIAGNOSIS — N390 Urinary tract infection, site not specified: Secondary | ICD-10-CM | POA: Diagnosis present

## 2021-06-30 DIAGNOSIS — Z8261 Family history of arthritis: Secondary | ICD-10-CM | POA: Diagnosis not present

## 2021-06-30 DIAGNOSIS — N200 Calculus of kidney: Secondary | ICD-10-CM | POA: Diagnosis present

## 2021-06-30 DIAGNOSIS — G300 Alzheimer's disease with early onset: Secondary | ICD-10-CM | POA: Diagnosis not present

## 2021-06-30 DIAGNOSIS — K219 Gastro-esophageal reflux disease without esophagitis: Secondary | ICD-10-CM | POA: Diagnosis present

## 2021-06-30 DIAGNOSIS — E1169 Type 2 diabetes mellitus with other specified complication: Secondary | ICD-10-CM | POA: Diagnosis present

## 2021-06-30 DIAGNOSIS — Z83438 Family history of other disorder of lipoprotein metabolism and other lipidemia: Secondary | ICD-10-CM | POA: Diagnosis not present

## 2021-06-30 DIAGNOSIS — Z6839 Body mass index (BMI) 39.0-39.9, adult: Secondary | ICD-10-CM | POA: Diagnosis not present

## 2021-06-30 DIAGNOSIS — Z20822 Contact with and (suspected) exposure to covid-19: Secondary | ICD-10-CM | POA: Diagnosis present

## 2021-06-30 DIAGNOSIS — Z8249 Family history of ischemic heart disease and other diseases of the circulatory system: Secondary | ICD-10-CM | POA: Diagnosis not present

## 2021-06-30 DIAGNOSIS — G4733 Obstructive sleep apnea (adult) (pediatric): Secondary | ICD-10-CM | POA: Diagnosis present

## 2021-06-30 DIAGNOSIS — E872 Acidosis, unspecified: Secondary | ICD-10-CM | POA: Diagnosis present

## 2021-06-30 DIAGNOSIS — Z87891 Personal history of nicotine dependence: Secondary | ICD-10-CM | POA: Diagnosis not present

## 2021-06-30 DIAGNOSIS — E669 Obesity, unspecified: Secondary | ICD-10-CM | POA: Diagnosis present

## 2021-06-30 LAB — URINALYSIS, ROUTINE W REFLEX MICROSCOPIC
Bilirubin Urine: NEGATIVE
Glucose, UA: NEGATIVE mg/dL
Ketones, ur: 5 mg/dL — AB
Nitrite: NEGATIVE
Protein, ur: 100 mg/dL — AB
RBC / HPF: >50 RBC/hpf — ABNORMAL HIGH (ref 0–5)
Specific Gravity, Urine: 1.028 (ref 1.005–1.030)
WBC, UA: >50 WBC/hpf — ABNORMAL HIGH (ref 0–5)
pH: 5 (ref 5.0–8.0)

## 2021-06-30 LAB — CBC WITH DIFFERENTIAL/PLATELET
Abs Immature Granulocytes: 0.12 10*3/uL — ABNORMAL HIGH (ref 0.00–0.07)
Basophils Absolute: 0.1 10*3/uL (ref 0.0–0.1)
Basophils Relative: 0 %
Eosinophils Absolute: 0 10*3/uL (ref 0.0–0.5)
Eosinophils Relative: 0 %
HCT: 44.8 % (ref 39.0–52.0)
Hemoglobin: 15.2 g/dL (ref 13.0–17.0)
Immature Granulocytes: 1 %
Lymphocytes Relative: 12 %
Lymphs Abs: 2.3 10*3/uL (ref 0.7–4.0)
MCH: 32.5 pg (ref 26.0–34.0)
MCHC: 33.9 g/dL (ref 30.0–36.0)
MCV: 95.7 fL (ref 80.0–100.0)
Monocytes Absolute: 1.1 10*3/uL — ABNORMAL HIGH (ref 0.1–1.0)
Monocytes Relative: 6 %
Neutro Abs: 15.5 10*3/uL — ABNORMAL HIGH (ref 1.7–7.7)
Neutrophils Relative %: 81 %
Platelets: 218 10*3/uL (ref 150–400)
RBC: 4.68 MIL/uL (ref 4.22–5.81)
RDW: 13 % (ref 11.5–15.5)
WBC: 19.1 10*3/uL — ABNORMAL HIGH (ref 4.0–10.5)
nRBC: 0 % (ref 0.0–0.2)

## 2021-06-30 LAB — COMPREHENSIVE METABOLIC PANEL
ALT: 16 U/L (ref 0–44)
AST: 16 U/L (ref 15–41)
Albumin: 3.7 g/dL (ref 3.5–5.0)
Alkaline Phosphatase: 43 U/L (ref 38–126)
Anion gap: 9 (ref 5–15)
BUN: 13 mg/dL (ref 8–23)
CO2: 23 mmol/L (ref 22–32)
Calcium: 9.7 mg/dL (ref 8.9–10.3)
Chloride: 101 mmol/L (ref 98–111)
Creatinine, Ser: 0.85 mg/dL (ref 0.61–1.24)
GFR, Estimated: >60 mL/min (ref >60)
Glucose, Bld: 141 mg/dL — ABNORMAL HIGH (ref 70–99)
Potassium: 3.9 mmol/L (ref 3.5–5.1)
Sodium: 133 mmol/L — ABNORMAL LOW (ref 135–145)
Total Bilirubin: 0.6 mg/dL (ref 0.3–1.2)
Total Protein: 7.1 g/dL (ref 6.5–8.1)

## 2021-06-30 LAB — LACTIC ACID, PLASMA
Lactic Acid, Venous: 1.9 mmol/L (ref 0.5–1.9)
Lactic Acid, Venous: 2 mmol/L (ref 0.5–1.9)

## 2021-06-30 LAB — RESP PANEL BY RT-PCR (FLU A&B, COVID) ARPGX2
Influenza A by PCR: NEGATIVE
Influenza B by PCR: NEGATIVE
SARS Coronavirus 2 by RT PCR: NEGATIVE

## 2021-06-30 LAB — VALPROIC ACID LEVEL: Valproic Acid Lvl: <10 ug/mL — ABNORMAL LOW (ref 50.0–100.0)

## 2021-06-30 LAB — CBG MONITORING, ED: Glucose-Capillary: 158 mg/dL — ABNORMAL HIGH (ref 70–99)

## 2021-06-30 MED ORDER — VITAMIN B-12 1000 MCG PO TABS
1000.0000 ug | ORAL_TABLET | Freq: Every day | ORAL | Status: DC
  Administered 2021-07-01 – 2021-07-04 (×4): 1000 ug via ORAL
  Filled 2021-06-30 (×4): qty 1

## 2021-06-30 MED ORDER — DONEPEZIL HCL 10 MG PO TABS
10.0000 mg | ORAL_TABLET | Freq: Every day | ORAL | Status: DC
  Administered 2021-06-30 – 2021-07-03 (×4): 10 mg via ORAL
  Filled 2021-06-30 (×5): qty 1

## 2021-06-30 MED ORDER — LACTATED RINGERS IV BOLUS (SEPSIS)
1000.0000 mL | Freq: Once | INTRAVENOUS | Status: AC
Start: 2021-06-30 — End: 2021-06-30
  Administered 2021-06-30: 1000 mL via INTRAVENOUS

## 2021-06-30 MED ORDER — ACETAMINOPHEN 325 MG PO TABS
650.0000 mg | ORAL_TABLET | Freq: Once | ORAL | Status: AC
  Administered 2021-06-30: 650 mg via ORAL
  Filled 2021-06-30: qty 2

## 2021-06-30 MED ORDER — ENOXAPARIN SODIUM 40 MG/0.4ML IJ SOSY
40.0000 mg | PREFILLED_SYRINGE | INTRAMUSCULAR | Status: DC
  Administered 2021-06-30 – 2021-07-03 (×4): 40 mg via SUBCUTANEOUS
  Filled 2021-06-30 (×4): qty 0.4

## 2021-06-30 MED ORDER — PANTOPRAZOLE SODIUM 40 MG PO TBEC
40.0000 mg | DELAYED_RELEASE_TABLET | Freq: Every day | ORAL | Status: DC
  Administered 2021-07-01 – 2021-07-04 (×5): 40 mg via ORAL
  Filled 2021-06-30 (×5): qty 1

## 2021-06-30 MED ORDER — ACETAMINOPHEN 325 MG PO TABS
650.0000 mg | ORAL_TABLET | Freq: Four times a day (QID) | ORAL | Status: DC | PRN
  Administered 2021-06-30 – 2021-07-02 (×3): 650 mg via ORAL
  Filled 2021-06-30 (×4): qty 2

## 2021-06-30 MED ORDER — SODIUM CHLORIDE 0.9 % IV SOLN
2.0000 g | Freq: Once | INTRAVENOUS | Status: AC
  Administered 2021-06-30: 2 g via INTRAVENOUS
  Filled 2021-06-30: qty 20

## 2021-06-30 MED ORDER — RISPERIDONE 0.5 MG PO TABS
0.5000 mg | ORAL_TABLET | Freq: Every day | ORAL | Status: DC
  Administered 2021-06-30 – 2021-07-03 (×4): 0.5 mg via ORAL
  Filled 2021-06-30 (×5): qty 1

## 2021-06-30 MED ORDER — MEMANTINE HCL-DONEPEZIL HCL ER 28-10 MG PO CP24: 1.0000 | ORAL_CAPSULE | Freq: Every day | ORAL | Status: DC

## 2021-06-30 MED ORDER — INSULIN ASPART 100 UNIT/ML IJ SOLN
0.0000 [IU] | Freq: Three times a day (TID) | INTRAMUSCULAR | Status: DC
  Administered 2021-07-01: 1 [IU] via SUBCUTANEOUS
  Administered 2021-07-02: 2 [IU] via SUBCUTANEOUS
  Administered 2021-07-02: 1 [IU] via SUBCUTANEOUS
  Administered 2021-07-02: 2 [IU] via SUBCUTANEOUS
  Administered 2021-07-03: 3 [IU] via SUBCUTANEOUS
  Administered 2021-07-03: 1 [IU] via SUBCUTANEOUS
  Administered 2021-07-03: 2 [IU] via SUBCUTANEOUS
  Administered 2021-07-04: 1 [IU] via SUBCUTANEOUS
  Administered 2021-07-04: 2 [IU] via SUBCUTANEOUS

## 2021-06-30 MED ORDER — SODIUM CHLORIDE 0.9 % IV SOLN
2.0000 g | INTRAVENOUS | Status: DC
  Administered 2021-07-01 – 2021-07-03 (×3): 2 g via INTRAVENOUS
  Filled 2021-06-30 (×4): qty 20

## 2021-06-30 MED ORDER — DIVALPROEX SODIUM 250 MG PO DR TAB
250.0000 mg | DELAYED_RELEASE_TABLET | Freq: Every day | ORAL | Status: DC
  Administered 2021-06-30 – 2021-07-03 (×4): 250 mg via ORAL
  Filled 2021-06-30 (×5): qty 1

## 2021-06-30 MED ORDER — ACETAMINOPHEN 650 MG RE SUPP: 650.0000 mg | Freq: Four times a day (QID) | RECTAL | Status: DC | PRN

## 2021-06-30 MED ORDER — ESCITALOPRAM OXALATE 20 MG PO TABS
20.0000 mg | ORAL_TABLET | Freq: Every day | ORAL | Status: DC
  Administered 2021-07-01 – 2021-07-04 (×5): 20 mg via ORAL
  Filled 2021-06-30: qty 2
  Filled 2021-06-30: qty 1
  Filled 2021-06-30: qty 2
  Filled 2021-06-30 (×2): qty 1

## 2021-06-30 MED ORDER — ONDANSETRON HCL 4 MG/2ML IJ SOLN: 4.0000 mg | Freq: Four times a day (QID) | INTRAMUSCULAR | Status: DC | PRN

## 2021-06-30 MED ORDER — LACTATED RINGERS IV BOLUS (SEPSIS)
500.0000 mL | Freq: Once | INTRAVENOUS | Status: AC
  Administered 2021-06-30: 500 mL via INTRAVENOUS

## 2021-06-30 MED ORDER — BUPROPION HCL ER (XL) 150 MG PO TB24
150.0000 mg | ORAL_TABLET | Freq: Every day | ORAL | Status: DC
  Administered 2021-06-30 – 2021-07-04 (×5): 150 mg via ORAL
  Filled 2021-06-30 (×5): qty 1

## 2021-06-30 MED ORDER — INSULIN ASPART 100 UNIT/ML IJ SOLN
0.0000 [IU] | Freq: Every day | INTRAMUSCULAR | Status: DC
  Administered 2021-07-02: 2 [IU] via SUBCUTANEOUS

## 2021-06-30 MED ORDER — DIVALPROEX SODIUM 125 MG PO DR TAB
125.0000 mg | DELAYED_RELEASE_TABLET | Freq: Every day | ORAL | Status: DC
  Administered 2021-07-01 – 2021-07-04 (×4): 125 mg via ORAL
  Filled 2021-06-30 (×4): qty 1

## 2021-06-30 MED ORDER — ONDANSETRON HCL 4 MG PO TABS: 4.0000 mg | ORAL_TABLET | Freq: Four times a day (QID) | ORAL | Status: DC | PRN

## 2021-06-30 MED ORDER — ACETAMINOPHEN 500 MG PO TABS
1000.0000 mg | ORAL_TABLET | Freq: Once | ORAL | Status: AC
  Administered 2021-06-30: 1000 mg via ORAL
  Filled 2021-06-30: qty 2

## 2021-06-30 MED ORDER — MEMANTINE HCL ER 28 MG PO CP24
28.0000 mg | ORAL_CAPSULE | Freq: Every day | ORAL | Status: DC
  Administered 2021-06-30 – 2021-07-03 (×4): 28 mg via ORAL
  Filled 2021-06-30 (×5): qty 1

## 2021-06-30 MED ORDER — SODIUM CHLORIDE 0.9 % IV SOLN: 2.0000 g | INTRAVENOUS | Status: DC

## 2021-06-30 NOTE — Telephone Encounter (Signed)
Patient went to the ED. See notes. 

## 2021-06-30 NOTE — Progress Notes (Signed)
[facility] is following sepsis bundle. 

## 2021-06-30 NOTE — ED Notes (Signed)
Gave pt something to eat and drink  

## 2021-06-30 NOTE — H&P (Signed)
History and Physical    KHALEEL FINGERHUT [id number] DOB: 1946/02/02 DOA: 06/29/2021  PCP: Ria Bush, MD  Patient coming from: Home  I have personally briefly reviewed patient's old medical records available.   Chief Complaint: Inguinal pain, worsening confusion  HPI: Cole Harris is a 75 y.o. male with medical history significant of advanced dementia, Dr. Diabetes on metformin, coronary artery disease, hypertension, pulmonary hypertension brought from home by his son with hematuria, complains of lower abdominal and groin pain, worsening confusion and screaming with urination.  Patient is very uncooperative.  He is not talking.  Had episodes of outburst and does not focus.  History is taken from medical records, his previous doctor visits and son at the bedside.  Patient lives at home with his wife and son.  He is basically 24/7 supervision and care.  Only oriented to his name at baseline.  Does have episodic outbursts and confusion even at home.  Last few days he has been acting differently and worse.  He was noting pain on both his groins.  Yesterday his son noticed blood in his pants so brought him to ER. ED Course: Temperature 101.3, WC count 19 K.  Lactic acid pending.  Blood pressures adequate.  Treated with sepsis protocol with 30 mill per kilogram of IV fluids, started on Rocephin after collecting urine cultures and blood cultures.  CT scan of the abdomen pelvis with nonobstructive stone, no hydronephrosis.  Urine grossly abnormal. COVID-19 pending.  Chest x-ray normal.  Review of Systems: Unable due to altered mental status.    Past Medical History:  Diagnosis Date   Alzheimer's disease with early onset ([place]) 08/24/2017   Arthritis    CAD (coronary artery disease) 08/24/2017   Depression    Diabetes mellitus without complication ([place])    GERD (gastroesophageal reflux disease)    Hyperlipidemia    Hypertension    Pulmonary hypertension ([place]) 08/24/2017    Vertebrobasilar insufficiency 08/24/2017    Past Surgical History:  Procedure Laterality Date   [place]   INTERNAL TENS UNIT  2002   [place] history   reports that he has quit smoking. He has never used smokeless tobacco. He reports that he does not drink alcohol and does not use drugs.  No Known Allergies  Family History  Problem Relation Age of Onset   Arthritis Mother    Arthritis Father    Hearing loss Father    Heart disease Father    Hyperlipidemia Father      Prior to Admission medications   Medication Sig Start Date End Date Taking? Authorizing Provider  buPROPion (WELLBUTRIN XL) 150 MG 24 hr tablet TAKE 1 TABLET DAILY 08/28/19   Elby Beck, FNP  Cholecalciferol (VITAMIN D3) 50000 units TABS Take 50,000 Units by mouth once a week.    [provider]  divalproex (DEPAKOTE) 125 MG DR tablet Take by mouth. 1 in am 2 at bedtime 04/07/21   [provider]  escitalopram (LEXAPRO) 20 MG tablet TAKE 1 TABLET DAILY 08/28/19   Elby Beck, FNP  furosemide (LASIX) 40 MG tablet TAKE 1 TABLET BY MOUTH EVERY DAY 05/27/21   Ria Bush, MD  HYDROcodone-acetaminophen [facility]) 10-325 MG tablet Take 1-2 tablets by mouth every 8 (eight) hours as needed for severe pain. 08/18/20   Elby Beck, FNP  irbesartan (AVAPRO) 300 MG tablet Take 1 tablet (300 mg total) by mouth daily. 06/16/20   Clarene Reamer  B, FNP  Lido-Capsaicin-Men-Methyl Sal (1ST MEDX-PATCH/ LIDOCAINE) 4-0.374-01-16 % PTCH Apply 1 patch topically daily. Patient taking differently: Apply 1 patch topically daily. As needed. 10/03/19   Elby Beck, FNP  meclizine (ANTIVERT) 25 MG tablet TAKE 1 TABLET 3 TIMES DAILYAS NEEDED FOR DIZZINESS OR NAUSEA 01/31/19   Elby Beck, FNP  Memantine HCl-Donepezil HCl 28-10 MG CP24 Take 1 capsule by mouth daily.    [provider]  metFORMIN (GLUCOPHAGE-XR) 500 MG 24 hr tablet TAKE 1 TABLET BY MOUTH  EVERY DAY WITH BREAKFAST 05/05/21   Ria Bush, MD  naproxen sodium (ALEVE) 220 MG tablet Take 220 mg by mouth.    [provider]  omeprazole (PRILOSEC) 20 MG capsule Take 1 capsule (20 mg total) by mouth daily. 02/05/21   Ria Bush, MD  risperiDONE (RISPERDAL) 0.25 MG tablet Take 1 tab at night for two weeks then increase to 2 tabs at night and continue that dose 06/18/21   [provider]  rosuvastatin (CRESTOR) 20 MG tablet Take 1 tablet (20 mg total) by mouth daily. 12/12/20   Ria Bush, MD  vitamin B-12 (CYANOCOBALAMIN) 1000 MCG tablet Take by mouth.    [provider]    Physical Exam: Vitals:   06/30/21 0800 06/30/21 1057 06/30/21 1406 06/30/21 1434  BP: (!) 144/114 (!) 147/65 (!) 124/95   Pulse: 89 79 76   Resp: 20 19 16    Temp: (!) 101.4 F (38.6 C) 100.2 F (37.9 C) 99 F (37.2 C) (!) 100.6 F (38.1 C)  TempSrc: Oral Oral Oral Oral  SpO2: 97% 96% 97%     Constitutional: NAD, calm, comfortable while not disturbed.  Easily distractible and unable to engage on conversation.  Vitals:   06/30/21 0800 06/30/21 1057 06/30/21 1406 06/30/21 1434  BP: (!) 144/114 (!) 147/65 (!) 124/95   Pulse: 89 79 76   Resp: 20 19 16    Temp: (!) 101.4 F (38.6 C) 100.2 F (37.9 C) 99 F (37.2 C) (!) 100.6 F (38.1 C)  TempSrc: Oral Oral Oral Oral  SpO2: 97% 96% 97%    Eyes: PERRL, lids and conjunctivae normal ENMT: Mucous membranes are dry. Posterior pharynx clear of any exudate or lesions.Normal dentition.  Neck: normal, supple, no masses, no thyromegaly Respiratory: clear to auscultation bilaterally, no wheezing, no crackles. Normal respiratory effort. No accessory muscle use.  No added sounds. Cardiovascular: Regular rate and rhythm, no murmurs / rubs / gallops. No extremity edema. 2+ pedal pulses. No carotid bruits.  Abdomen: no palpable tenderness.  no masses palpated. No hepatosplenomegaly. Bowel sounds positive.  Musculoskeletal: no  clubbing / cyanosis. No joint deformity upper and lower extremities. Good ROM, no contractures. Normal muscle tone.  Skin: no rashes, lesions, ulcers. No induration Neurologic: CN 2-12 grossly intact. Sensation intact, DTR normal. Strength 5/5 in all 4.  Moves all extremities. Psychiatric: Impaired judgment and insight. Alert and oriented x 0.  Flat affect.  Unable to engage in conversation..  Irritated.    Labs on Admission: I have personally reviewed following labs and imaging studies  CBC: Recent Labs  Lab 06/30/21 0014  WBC 19.1*  NEUTROABS 15.5*  HGB 15.2  HCT 44.8  MCV 95.7  PLT 99991111   Basic Metabolic Panel: Recent Labs  Lab 06/30/21 0014  NA 133*  K 3.9  CL 101  CO2 23  GLUCOSE 141*  BUN 13  CREATININE 0.85  CALCIUM 9.7   GFR: Estimated Creatinine Clearance: 96.6 mL/min (by C-G formula based  on SCr of 0.85 mg/dL). Liver Function Tests: Recent Labs  Lab 06/30/21 0014  AST 16  ALT 16  ALKPHOS 43  BILITOT 0.6  PROT 7.1  ALBUMIN 3.7   No results for input(s): LIPASE, AMYLASE in the last 168 hours. No results for input(s): AMMONIA in the last 168 hours. Coagulation Profile: No results for input(s): INR, PROTIME in the last 168 hours. Cardiac Enzymes: No results for input(s): CKTOTAL, CKMB, CKMBINDEX, TROPONINI in the last 168 hours. BNP (last 3 results) No results for input(s): PROBNP in the last 8760 hours. HbA1C: No results for input(s): HGBA1C in the last 72 hours. CBG: No results for input(s): GLUCAP in the last 168 hours. Lipid Profile: No results for input(s): CHOL, HDL, LDLCALC, TRIG, CHOLHDL, LDLDIRECT in the last 72 hours. Thyroid Function Tests: No results for input(s): TSH, T4TOTAL, FREET4, T3FREE, THYROIDAB in the last 72 hours. Anemia Panel: No results for input(s): VITAMINB12, FOLATE, FERRITIN, TIBC, IRON, RETICCTPCT in the last 72 hours. Urine analysis:    Component Value Date/Time   COLORURINE AMBER (A) 06/29/2021 2354    APPEARANCEUR CLOUDY (A) 06/29/2021 2354   LABSPEC 1.028 06/29/2021 2354   PHURINE 5.0 06/29/2021 2354   GLUCOSEU NEGATIVE 06/29/2021 2354   HGBUR LARGE (A) 06/29/2021 2354   BILIRUBINUR NEGATIVE 06/29/2021 2354   KETONESUR 5 (A) 06/29/2021 2354   PROTEINUR 100 (A) 06/29/2021 2354   NITRITE NEGATIVE 06/29/2021 2354   LEUKOCYTESUR MODERATE (A) 06/29/2021 2354    Radiological Exams on Admission: DG Chest 1 View  Result Date: 06/30/2021 CLINICAL DATA:  Sepsis, hematuria, dementia, increased agitation EXAM: CHEST  1 VIEW COMPARISON:  Portable exam 1652 hours without priors for comparison FINDINGS: Normal heart size, mediastinal contours, and pulmonary vascularity. Atherosclerotic calcification aorta. Lungs clear. No pulmonary infiltrate, pleural effusion, or pneumothorax. Osseous structures unremarkable. Intraspinal stimulator and postsurgical changes of cervical fusion noted. IMPRESSION: No acute abnormalities. Aortic Atherosclerosis (ICD10-I70.0). Electronically Signed   By: Ulyses Southward M.D.   On: 06/30/2021 17:03   CT Renal Stone Study  Result Date: 06/30/2021 CLINICAL DATA:  Hematuria EXAM: CT ABDOMEN AND PELVIS WITHOUT CONTRAST TECHNIQUE: Multidetector CT imaging of the abdomen and pelvis was performed following the standard protocol without IV contrast. COMPARISON:  None. FINDINGS: Lower chest: Lung bases are clear. Hepatobiliary: Unenhanced liver is unremarkable. Gallbladder is unremarkable. No intrahepatic or extrahepatic ductal dilatation. Pancreas: Within normal limits. Spleen: Within normal limits. Adrenals/Urinary Tract: Adrenal glands are within normal limits. Punctate nonobstructing left lower pole renal calculus (series 3/image 46). Right kidney is within normal limits. No hydronephrosis. Bladder is mildly thick-walled although underdistended. Stomach/Bowel: Stomach is within normal limits. No evidence of bowel obstruction. Appendix is not discretely visualized. Mild left colonic  diverticulosis, without evidence of diverticulitis. Vascular/Lymphatic: No evidence of abdominal aortic aneurysm. Atherosclerotic calcifications of the abdominal aorta and branch vessels. Retroaortic left renal vein. No suspicious abdominopelvic lymphadenopathy. Reproductive: Prostate is unremarkable. Other: No abdominopelvic ascites. Small fat containing periumbilical hernia (series 3/image 68). Musculoskeletal: Thoracic spine stimulator. Degenerative changes of the visualized thoracolumbar spine. 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 IMPRESSION: Punctate nonobstructing left lower pole renal calculus. No hydronephrosis. Thick-walled bladder, although underdistended. Electronically Signed   By: Charline Bills M.D.   On: 06/30/2021 03:13    EKG: Independently reviewed.  Not indicated.  Assessment/Plan Principal Problem:   Sepsis (HCC) Active Problems:   Alzheimer's disease with early onset (HCC)   CAD (coronary artery disease)   Dyslipidemia associated with type 2 diabetes mellitus (HCC)  Hypothyroidism   GERD (gastroesophageal reflux disease)     1.  Sepsis present on admission secondary to acute UTI with hematuria, acute metabolic encephalopathy in a patient with underlying dementia. Patient has temperature 101.4, tachypnea and altered mental status. Resuscitated in the ER.  Blood pressures are adequate.  Perfusion is adequate. Continue maintenance IV fluids.  Continue Rocephin 2 g daily until blood cultures and urine cultures available. CT scan of the abdomen pelvis with no evidence of obstruction or hydronephrosis.  2.  Acute metabolic encephalopathy in a patient with underlying advanced dementia: Fall precautions.  Delirium precautions.  Due to advanced dementia, will let visitor stay with him at off-hours. Patient is on multiple medications at home including Wellbutrin, Celexa and risperidone at home.  Continue. Patient is on donepezil and Namenda that will be continued.  3.  GERD: On  PPI continue.  4.  Essential hypertension: Blood pressures stable.  Risk of hypotension.  Hold all antihypertensive medications.  5.  Type 2 diabetes, well controlled on metformin: Hold metformin.  We will keep on sliding scale insulin.   DVT prophylaxis: Lovenox subcu Code Status: Full code.  As discussed with patient's son at the bedside. Family Communication: Son at the bedside Disposition Plan: Home Consults called: None Admission status: Inpatient.  Telemetry.  Patient presents with significant symptoms with sepsis, UTI with hematuria and needs to be hospitalized, treated with IV fluids and antibiotics with close monitoring.  Anticipate to stay more than 2 midnights.   Barb Merino MD [facility] Pager [phone]

## 2021-06-30 NOTE — ED Notes (Signed)
Spoke with pt about the wait. Fever noted. Triage RN made aware.

## 2021-06-30 NOTE — Telephone Encounter (Signed)
PLEASE NOTE: All timestamps contained within this report are represented as [place] Standard Time. CONFIDENTIALTY NOTICE: This fax transmission is intended only for the addressee. It contains information that is legally privileged, confidential or otherwise protected from use or disclosure. If you are not the intended recipient, you are strictly prohibited from reviewing, disclosing, copying using or disseminating any of this information or taking any action in reliance on or regarding this information. If you have received this fax in error, please notify us immediately by telephone so that we can arrange for its return to [place]. Phone: [phone], Toll-Free: [phone], Fax: [phone] Page: 1 of 2 Call Id: [id number] [facility] Night - Client TELEPHONE ADVICE RECORD [facility] Patient Name: Cole Harris Gender: Male DOB: 10-04-1945 Age: 75 Y 28 D Return Phone Number: [phone] (Primary), [phone] (Secondary) Address[place] [address] Client [facility] Night - Client Client Site [facility] - Night Physician Eustaquio Boyden - MD Contact Type Call Who Is Calling Patient / Member / Family / Caregiver Call Type Triage / Clinical Caller Name Hy Swiatek Relationship To Patient Daughter Return Phone Number [phone] (Primary) Chief Complaint Hallucinations Reason for Call Symptomatic / Request for Health Information Initial Comment Caller states her father has blood in his urine and is hallucinating. He does have dementia and he is more confused than usual. Translation No Nurse Assessment Nurse: Colon, RN, Clotilde Dieter Date/Time (Eastern Time): 06/29/2021 8:36:36 PM Confirm and document reason for call. If symptomatic, describe symptoms. ---Caller states her father has blood in his urine and is hallucinating. He does have dementia and he is more confused than usual. He worsened  this morning. Seem like his side hurt. His neurologist ordered a UA, found some glucose. Does the patient have any new or worsening symptoms? ---Yes Will a triage be completed? ---Yes Related visit to physician within the last 2 weeks? ---Yes Does the PT have any chronic conditions? (i.e. diabetes, asthma, this includes High risk factors for pregnancy, etc.) ---Yes List chronic conditions. ---dementia DM Is this a behavioral health or substance abuse call? ---No Guidelines Guideline Title Affirmed Question Affirmed Notes Nurse Date/Time (Eastern Time) Urine - Blood In [1] Pain or burning with passing urine AND [2] side (flank) or back pain present Colon, RN, [facility] 06/29/2021 8:40:40 PM Disp. Time Lamount Cohen Time) Disposition Final User PLEASE NOTE: All timestamps contained within this report are represented as [place] Standard Time. CONFIDENTIALTY NOTICE: This fax transmission is intended only for the addressee. It contains information that is legally privileged, confidential or otherwise protected from use or disclosure. If you are not the intended recipient, you are strictly prohibited from reviewing, disclosing, copying using or disseminating any of this information or taking any action in reliance on or regarding this information. If you have received this fax in error, please notify us immediately by telephone so that we can arrange for its return to [place]. Phone: [phone], Toll-Free: [phone], Fax: [phone] Page: 2 of 2 Call Id: [id number] 06/29/2021 8:46:38 PM See HCP within 4 Hours (or PCP triage) Yes Colon, RN, [facility] Disagree/Comply Comply Caller Understands Yes PreDisposition InappropriateToAsk Care Advice Given Per Guideline SEE HCP (OR PCP TRIAGE) WITHIN 4 HOURS: * IF OFFICE WILL BE CLOSED AND NO PCP (PRIMARY CARE PROVIDER) SECOND-LEVEL TRIAGE: You need to be seen within the next 3 or 4 hours. A nearby Urgent Care Center [facility]) is often a good source of  care. Another choice is to  go to the ED. Go sooner if you become worse. CALL BACK IF: * Fever occurs * You become worse CARE ADVICE given per Urine, Blood In (Adult) guideline. PAIN MEDICINES: * ACETAMINOPHEN - REGULAR STRENGTH TYLENOL: Take 650 mg (two 325 mg pills) by mouth every 4 to 6 hours as needed. Each Regular Strength Tylenol pill has 325 mg of acetaminophen. The most you should take is 10 pills a day (3,250 mg total). Note: In [place], the maximum is 12 pills a day (3,900 mg total). Referrals [facility] - ED

## 2021-06-30 NOTE — ED Provider Notes (Signed)
[facility] EMERGENCY DEPARTMENT Provider Note   CSN: [id number] Arrival date & time: 06/29/21  2322     History Chief Complaint  Patient presents with   Hematuria    HOSKIE DELOE is a 75 y.o. male with history of Alzheimer's disease (oriented to self only at baseline), diabetes mellitus, CAD, hypertension, pulmonary hypertension.  Patient brought to the emergency department by his son who is at bedside.  Level 5 caveat applies due to patient's Alzheimer's disease.  Information obtained from patient's son.  Patient son reports that patient has been having hematuria, lower abdominal pain, increased confusion, and dysuria since Sunday.  Lower abdominal pain is intermittent and seems to be worse with urination.  Patient previously a smoker however has not smoked cigarettes since 1996.   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/2021   GERD (gastroesophageal reflux disease) 12/05/2020   Nontraumatic rupture of right proximal biceps tendon 03/29/2019   OSA treated with BiPAP 02/24/2019   Controlled diabetes mellitus type 2 with complications ([place]) [id number]   Alzheimer's disease with early onset ([place]) 08/24/2017   Vertebrobasilar insufficiency 08/24/2017   CAD (coronary artery disease) 08/24/2017   Dyslipidemia associated with type 2 diabetes mellitus ([place]) 08/24/2017   Pulmonary hypertension ([place]) 08/24/2017   Hypothyroidism 08/24/2017   Vitamin B12 deficiency 08/16/2017    Past Surgical History:  Procedure Laterality Date   BACK  SURGERY  1998   INTERNAL TENS UNIT  2002   TONSILLECTOMY  1955       Family History  Problem Relation Age of Onset   Arthritis Mother    Arthritis Father    Hearing loss Father    Heart disease Father    Hyperlipidemia Father     Social History   Tobacco Use   Smoking status: Former   Smokeless tobacco: Never  Scientific laboratory technician Use: Never used  Substance Use Topics   Alcohol use: No   Drug use: No    Home Medications Prior to Admission medications   Medication Sig Start Date End Date Taking? Authorizing Provider  buPROPion (WELLBUTRIN XL) 150 MG 24 hr tablet TAKE 1 TABLET DAILY 08/28/19   Elby Beck, FNP  Cholecalciferol (VITAMIN D3) 50000 units TABS Take 50,000 Units by mouth once a week.    [provider]  divalproex (DEPAKOTE) 125 MG DR tablet Take by mouth. 1 in am 2 at bedtime 04/07/21   [provider]  escitalopram (LEXAPRO) 20 MG tablet TAKE 1 TABLET DAILY 08/28/19   Elby Beck, FNP  furosemide (LASIX) 40 MG tablet TAKE 1 TABLET BY MOUTH EVERY DAY 05/27/21   Ria Bush, MD  HYDROcodone-acetaminophen [facility]) 10-325 MG tablet Take 1-2 tablets by mouth every 8 (eight) hours as needed for severe pain. 08/18/20   Elby Beck, FNP  irbesartan (AVAPRO) 300 MG tablet Take 1 tablet (300 mg total) by mouth daily. 06/16/20   Elby Beck, FNP  Lido-Capsaicin-Men-Methyl Sal (1ST MEDX-PATCH/ LIDOCAINE) 4-0.374-01-16 % PTCH Apply 1 patch topically daily.  Patient taking differently: Apply 1 patch topically daily. As needed. 10/03/19   Emi Belfast, FNP  meclizine (ANTIVERT) 25 MG tablet TAKE 1 TABLET 3 TIMES DAILYAS NEEDED FOR DIZZINESS OR NAUSEA 01/31/19   Emi Belfast, FNP  Memantine HCl-Donepezil HCl 28-10 MG CP24 Take 1 capsule by mouth daily.    [provider]  metFORMIN (GLUCOPHAGE-XR) 500 MG 24 hr tablet TAKE 1 TABLET BY MOUTH EVERY DAY WITH BREAKFAST 05/05/21   Eustaquio Boyden, MD  naproxen sodium  (ALEVE) 220 MG tablet Take 220 mg by mouth.    [provider]  omeprazole (PRILOSEC) 20 MG capsule Take 1 capsule (20 mg total) by mouth daily. 02/05/21   Eustaquio Boyden, MD  risperiDONE (RISPERDAL) 0.25 MG tablet Take 1 tab at night for two weeks then increase to 2 tabs at night and continue that dose 06/18/21   [provider]  rosuvastatin (CRESTOR) 20 MG tablet Take 1 tablet (20 mg total) by mouth daily. 12/12/20   Eustaquio Boyden, MD  vitamin B-12 (CYANOCOBALAMIN) 1000 MCG tablet Take by mouth.    [provider]    Allergies    Patient has no known allergies.  Review of Systems   Review of Systems  Unable to perform ROS: Dementia  Genitourinary:  Positive for hematuria.   Physical Exam Updated Vital Signs BP (!) 124/95 (BP Location: Left Arm)   Pulse 76   Temp (!) 100.6 F (38.1 C) (Oral)   Resp 16   SpO2 97%   Physical Exam Vitals and nursing note reviewed.  Constitutional:      General: He is not in acute distress.    Appearance: He is not ill-appearing, toxic-appearing or diaphoretic.  HENT:     Head: Normocephalic.  Eyes:     General: No scleral icterus.       Right eye: No discharge.        Left eye: No discharge.  Cardiovascular:     Rate and Rhythm: Normal rate.  Pulmonary:     Effort: Pulmonary effort is normal. No tachypnea, bradypnea or respiratory distress.     Breath sounds: Normal breath sounds. No stridor.  Abdominal:     General: Abdomen is protuberant. Bowel sounds are normal.     Palpations: Abdomen is soft. There is no mass or pulsatile mass.     Tenderness: There is no abdominal tenderness. There is no right CVA tenderness, left CVA tenderness, guarding or rebound.     Hernia: A hernia is present. Hernia is present in the umbilical area.     Comments: Easily reducible umbilical hernia  Musculoskeletal:     Right lower leg: Normal.     Left lower leg: Normal.  Skin:    General: Skin is warm and dry.   Neurological:     General: No focal deficit present.     Mental Status: He is alert.  Psychiatric:        Behavior: Behavior is cooperative.    ED Results / Procedures / Treatments   Labs (all labs ordered are listed, but only abnormal results are displayed) Labs Reviewed  CBC WITH DIFFERENTIAL/PLATELET - Abnormal; Notable for the following components:      Result Value   WBC 19.1 (*)    Neutro Abs 15.5 (*)    Monocytes Absolute 1.1 (*)    Abs Immature Granulocytes 0.12 (*)    All other components within normal limits  URINALYSIS, ROUTINE W REFLEX MICROSCOPIC - Abnormal; Notable for the  following components:   Color, Urine AMBER (*)    APPearance CLOUDY (*)    Hgb urine dipstick LARGE (*)    Ketones, ur 5 (*)    Protein, ur 100 (*)    Leukocytes,Ua MODERATE (*)    RBC / HPF >50 (*)    WBC, UA >50 (*)    Bacteria, UA RARE (*)    Non Squamous Epithelial 0-5 (*)    All other components within normal limits  COMPREHENSIVE METABOLIC PANEL - Abnormal; Notable for the following components:   Sodium 133 (*)    Glucose, Bld 141 (*)    All other components within normal limits  URINE CULTURE  RESP PANEL BY RT-PCR (FLU A&B, COVID) ARPGX2  CULTURE, BLOOD (ROUTINE X 2)  CULTURE, BLOOD (ROUTINE X 2)  LACTIC ACID, PLASMA  LACTIC ACID, PLASMA  VALPROIC ACID LEVEL    EKG None  Radiology CT Renal Stone Study  Result Date: 06/30/2021 CLINICAL DATA:  Hematuria EXAM: CT ABDOMEN AND PELVIS WITHOUT CONTRAST TECHNIQUE: Multidetector CT imaging of the abdomen and pelvis was performed following the standard protocol without IV contrast. COMPARISON:  None. FINDINGS: Lower chest: Lung bases are clear. Hepatobiliary: Unenhanced liver is unremarkable. Gallbladder is unremarkable. No intrahepatic or extrahepatic ductal dilatation. Pancreas: Within normal limits. Spleen: Within normal limits. Adrenals/Urinary Tract: Adrenal glands are within normal limits. Punctate nonobstructing left lower pole  renal calculus (series 3/image 46). Right kidney is within normal limits. No hydronephrosis. Bladder is mildly thick-walled although underdistended. Stomach/Bowel: Stomach is within normal limits. No evidence of bowel obstruction. Appendix is not discretely visualized. Mild left colonic diverticulosis, without evidence of diverticulitis. Vascular/Lymphatic: No evidence of abdominal aortic aneurysm. Atherosclerotic calcifications of the abdominal aorta and branch vessels. Retroaortic left renal vein. No suspicious abdominopelvic lymphadenopathy. Reproductive: Prostate is unremarkable. Other: No abdominopelvic ascites. Small fat containing periumbilical hernia (series 3/image 68). Musculoskeletal: Thoracic spine stimulator. Degenerative changes of the visualized thoracolumbar spine. 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 IMPRESSION: Punctate nonobstructing left lower pole renal calculus. No hydronephrosis. Thick-walled bladder, although underdistended. Electronically Signed   By: Julian Hy M.D.   On: 06/30/2021 03:13    Procedures .Critical Care Performed by: Loni Beckwith, PA-C Authorized by: Loni Beckwith, PA-C   Critical care provider statement:    Critical care time (minutes):  45   Critical care was necessary to treat or prevent imminent or life-threatening deterioration of the following conditions:  Sepsis   Critical care was time spent personally by me on the following activities:  Development of treatment plan with patient or surrogate, evaluation of patient's response to treatment, examination of patient, obtaining history from patient or surrogate, ordering and performing treatments and interventions, ordering and review of laboratory studies, ordering and review of radiographic studies, pulse oximetry, re-evaluation of patient's condition and review of old charts   Care discussed with: admitting provider     Medications Ordered in ED Medications  cefTRIAXone (ROCEPHIN) 2 g in  sodium chloride 0.9 % 100 mL IVPB (2 g Intravenous New Bag/Given 06/30/21 1610)  lactated ringers bolus 1,000 mL (has no administration in time range)    And  lactated ringers bolus 1,000 mL (has no administration in time range)    And  lactated ringers bolus 1,000 mL (1,000 mLs Intravenous New Bag/Given 06/30/21 1619)    And  lactated ringers bolus 500 mL (has no administration in time range)  acetaminophen (TYLENOL) tablet 650 mg (650 mg Oral Given 06/30/21 0834)  acetaminophen (TYLENOL) tablet  1,000 mg (1,000 mg Oral Given 06/30/21 1628)    ED Course  I have reviewed the triage vital signs and the nursing notes.  Pertinent labs & imaging results that were available during my care of the patient were reviewed by me and considered in my medical decision making (see chart for details).  Clinical Course as of 06/30/21 1636  Tue Jun 30, 2021  1522 Spoke to hospitalist Dr. Raelyn Mora who will see the patient for admission. [PB]    Clinical Course User Index [PB] Dyann Ruddle   MDM Rules/Calculators/A&P                           Alert 75 year old male no acute distress, nontoxic-appearing.  Patient has history of Alzheimer's dementia alert to self only at baseline.  Level 5 caveat applies.  Patient's son at bedside provides information.  States patient has had hematuria, dysuria, lower abdominal pain and increased confusion since Sunday.  Lab work and CT renal study obtained while patient was in triage.  CT renal study shows punctate nonobstructing left lower pole renal calculus.  No hydronephrosis.  Thick-walled bladder.  Patient has leukocytosis at 19.1, T-max of 101.4 F, and signs of urinary tract infection.  Patient meets sepsis criteria.  Will obtain blood cultures and lactic acid.  We will start patient on ceftriaxone and initiate 30 mL/kg fluid bolus.  Will contact hospitalist for admission.  Spoke to hospitalist Dr. Sloan Leiter who will see the patient for  admission.  Patient was discussed with and evaluated by Dr. Gilford Raid.   Final Clinical Impression(s) / ED Diagnoses Final diagnoses:  Sepsis, due to unspecified organism, unspecified whether acute organ dysfunction present [facility])    Rx / DC Orders ED Discharge Orders     None        Loni Beckwith, PA-C 06/30/21 1638    Isla Pence, MD 06/30/21 [phone]

## 2021-07-01 ENCOUNTER — Encounter (HOSPITAL_COMMUNITY): Payer: Self-pay | Admitting: Internal Medicine

## 2021-07-01 DIAGNOSIS — N390 Urinary tract infection, site not specified: Secondary | ICD-10-CM | POA: Diagnosis present

## 2021-07-01 LAB — BASIC METABOLIC PANEL
Anion gap: 9 (ref 5–15)
BUN: 8 mg/dL (ref 8–23)
CO2: 21 mmol/L — ABNORMAL LOW (ref 22–32)
Calcium: 9.1 mg/dL (ref 8.9–10.3)
Chloride: 102 mmol/L (ref 98–111)
Creatinine, Ser: 0.79 mg/dL (ref 0.61–1.24)
GFR, Estimated: >60 mL/min (ref >60)
Glucose, Bld: 128 mg/dL — ABNORMAL HIGH (ref 70–99)
Potassium: 3.9 mmol/L (ref 3.5–5.1)
Sodium: 132 mmol/L — ABNORMAL LOW (ref 135–145)

## 2021-07-01 LAB — CBC
HCT: 41.9 % (ref 39.0–52.0)
Hemoglobin: 13.8 g/dL (ref 13.0–17.0)
MCH: 33 pg (ref 26.0–34.0)
MCHC: 32.9 g/dL (ref 30.0–36.0)
MCV: 100.2 fL — ABNORMAL HIGH (ref 80.0–100.0)
Platelets: 154 10*3/uL (ref 150–400)
RBC: 4.18 MIL/uL — ABNORMAL LOW (ref 4.22–5.81)
RDW: 13 % (ref 11.5–15.5)
WBC: 20.5 10*3/uL — ABNORMAL HIGH (ref 4.0–10.5)
nRBC: 0 % (ref 0.0–0.2)

## 2021-07-01 LAB — GLUCOSE, CAPILLARY
Glucose-Capillary: 145 mg/dL — ABNORMAL HIGH (ref 70–99)
Glucose-Capillary: 133 mg/dL — ABNORMAL HIGH (ref 70–99)

## 2021-07-01 LAB — CBG MONITORING, ED: Glucose-Capillary: 128 mg/dL — ABNORMAL HIGH (ref 70–99)

## 2021-07-01 MED ORDER — LORAZEPAM 2 MG/ML IJ SOLN: 1.0000 mg | Freq: Once | INTRAMUSCULAR | Status: DC

## 2021-07-01 MED ORDER — HALOPERIDOL LACTATE 5 MG/ML IJ SOLN
2.0000 mg | Freq: Four times a day (QID) | INTRAMUSCULAR | Status: DC | PRN
  Administered 2021-07-01: 2 mg via INTRAVENOUS
  Filled 2021-07-01: qty 1

## 2021-07-01 NOTE — ED Notes (Signed)
Breakfast Orders placed 

## 2021-07-01 NOTE — Progress Notes (Addendum)
Progress Note    Cole Harris  [id number] DOB: [birth year]  DOA: 06/29/2021 PCP: Ria Bush, MD      Brief Narrative:    Medical records reviewed and are as summarized below:  Cole Harris is a 75 y.o. male with medical history significant for dementia, type 2 diabetes mellitus, CAD, hypertension, pulmonary hypertension, OSA (but does not use his CPAP) who was brought to the hospital because of abdominal pain, groin pain, bloody urine, increasing confusion and screaming with urination.  He was found to have sepsis secondary to acute UTI.    Assessment/Plan:   Principal Problem:   Sepsis ([place]) Active Problems:   Alzheimer's disease with early onset ([place])   CAD (coronary artery disease)   Dyslipidemia associated with type 2 diabetes mellitus ([place])   Hypothyroidism   GERD (gastroesophageal reflux disease)   Acute lower UTI   Severe sepsis secondary to acute UTI with hematuria, leukocytosis: Continue IV Rocephin.  Follow-up urine and blood cultures.  He was febrile, tachycardic and encephalopathic.  Lactic acid was 2.  Acute metabolic encephalopathy with underlying dementia: Continue psychotropics.  Continue supportive care  Nonobstructing left renal calculus: Outpatient follow-up with urologist  Type II DM: Metformin on hold.  NovoLog as needed for hyperglycemia.  OSA: Ordered CPAP for use at night.   Diet Order             Diet Carb Modified Fluid consistency: Thin; Room service appropriate? Yes  Diet effective now                      Consultants: None  Procedures: None    Medications:    buPROPion  150 mg Oral Daily   divalproex  125 mg Oral Daily   divalproex  250 mg Oral QHS   memantine  28 mg Oral QHS   And   donepezil  10 mg Oral QHS   enoxaparin (LOVENOX) injection  40 mg Subcutaneous Q24H   escitalopram  20 mg Oral Daily   insulin aspart  0-5 Units Subcutaneous QHS   insulin aspart  0-9 Units Subcutaneous TID WC    pantoprazole  40 mg Oral Daily   risperiDONE  0.5 mg Oral QHS   vitamin B-12  1,000 mcg Oral Daily   Continuous Infusions:  cefTRIAXone (ROCEPHIN)  IV Stopped (07/01/21 1157)     Anti-infectives (From admission, onward)    Start     Dose/Rate Route Frequency Ordered Stop   07/01/21 1000  cefTRIAXone (ROCEPHIN) 2 g in sodium chloride 0.9 % 100 mL IVPB        2 g 200 mL/hr over 30 Minutes Intravenous Every 24 hours 06/30/21 1936     07/01/21 0000  cefTRIAXone (ROCEPHIN) 2 g in sodium chloride 0.9 % 100 mL IVPB  Status:  Discontinued        2 g 200 mL/hr over 30 Minutes Intravenous Every 24 hours 06/30/21 1928 06/30/21 1936   06/30/21 1515  cefTRIAXone (ROCEPHIN) 2 g in sodium chloride 0.9 % 100 mL IVPB        2 g 200 mL/hr over 30 Minutes Intravenous  Once 06/30/21 1501 06/30/21 1659              Family Communication/Anticipated D/C date and plan/Code Status   DVT prophylaxis: enoxaparin (LOVENOX) injection 40 mg Start: 06/30/21 1930     Code Status: Full Code  Family Communication: [name], son, at the bedside Disposition Plan: Discharge to  home in 2 to 3 days   Status is: Inpatient  Remains inpatient appropriate because: Sepsis on IV antibiotics           Subjective:   Interval events noted.  Patient is sleepy and unable to provide any history.  [name], son, was at the bedside  Objective:    Vitals:   07/01/21 0955 07/01/21 1000 07/01/21 1130 07/01/21 1210  BP: 109/66 131/70  (!) 129/57  Pulse: 77     Resp: 16 20  20   Temp:   100.2 F (37.9 C) 99.2 F (37.3 C)  TempSrc:   Axillary Axillary  SpO2: 95%   93%   No data found.   Intake/Output Summary (Last 24 hours) at 07/01/2021 1541 Last data filed at 06/30/2021 1659 Gross per 24 hour  Intake 100 ml  Output --  Net 100 ml   There were no vitals filed for this visit.  Exam:  GEN: NAD SKIN: Warm and dry EYES: No pallor or icterus ENT: MMM CV: RRR PULM: CTA B ABD: soft, obese,  NT, +BS CNS: Sleepy but arousable although difficult to arouse.   EXT: No edema or tenderness        Data Reviewed:   I have personally reviewed following labs and imaging studies:  Labs: Labs show the following:   Basic Metabolic Panel: Recent Labs  Lab 06/30/21 0014 07/01/21 0342  NA 133* 132*  K 3.9 3.9  CL 101 102  CO2 23 21*  GLUCOSE 141* 128*  BUN 13 8  CREATININE 0.85 0.79  CALCIUM 9.7 9.1   GFR Estimated Creatinine Clearance: 102.7 mL/min (by C-G formula based on SCr of 0.79 mg/dL). Liver Function Tests: Recent Labs  Lab 06/30/21 0014  AST 16  ALT 16  ALKPHOS 43  BILITOT 0.6  PROT 7.1  ALBUMIN 3.7   No results for input(s): LIPASE, AMYLASE in the last 168 hours. No results for input(s): AMMONIA in the last 168 hours. Coagulation profile No results for input(s): INR, PROTIME in the last 168 hours.  CBC: Recent Labs  Lab 06/30/21 0014 07/01/21 0342  WBC 19.1* 20.5*  NEUTROABS 15.5*  --   HGB 15.2 13.8  HCT 44.8 41.9  MCV 95.7 100.2*  PLT 218 154   Cardiac Enzymes: No results for input(s): CKTOTAL, CKMB, CKMBINDEX, TROPONINI in the last 168 hours. BNP (last 3 results) No results for input(s): PROBNP in the last 8760 hours. CBG: Recent Labs  Lab 06/30/21 2321 07/01/21 0831 07/01/21 1251  GLUCAP 158* 128* 145*   D-Dimer: No results for input(s): DDIMER in the last 72 hours. Hgb A1c: No results for input(s): HGBA1C in the last 72 hours. Lipid Profile: No results for input(s): CHOL, HDL, LDLCALC, TRIG, CHOLHDL, LDLDIRECT in the last 72 hours. Thyroid function studies: No results for input(s): TSH, T4TOTAL, T3FREE, THYROIDAB in the last 72 hours.  Invalid input(s): FREET3 Anemia work up: No results for input(s): VITAMINB12, FOLATE, FERRITIN, TIBC, IRON, RETICCTPCT in the last 72 hours. Sepsis Labs: Recent Labs  Lab 06/30/21 0014 06/30/21 1616 06/30/21 1931 07/01/21 0342  WBC 19.1*  --   --  20.5*  LATICACIDVEN  --  1.9 2.0*   --     Microbiology Recent Results (from the past 240 hour(s))  Urine Culture     Status: Abnormal (Preliminary result)   Collection Time: 06/29/21 11:54 PM   Specimen: Urine, Clean Catch  Result Value Ref Range Status   Specimen Description URINE, CLEAN CATCH  Final  Special Requests NONE  Final   Culture (A)  Final    60,000 COLONIES/mL ESCHERICHIA COLI SUSCEPTIBILITIES TO FOLLOW Performed at [facility], [place] [address], [place], [place] [address]    Report Status PENDING  Incomplete  Blood culture (routine x 2)     Status: None (Preliminary result)   Collection Time: 06/30/21  3:15 PM   Specimen: BLOOD LEFT WRIST  Result Value Ref Range Status   Specimen Description BLOOD LEFT WRIST  Final   Special Requests   Final    BOTTLES DRAWN AEROBIC AND ANAEROBIC Blood Culture results may not be optimal due to an inadequate volume of blood received in culture bottles   Culture   Final    NO GROWTH < 12 HOURS Performed at [facility], [place] [address], [place], [place] [address]    Report Status PENDING  Incomplete  Resp Panel by RT-PCR (Flu A&B, Covid) Nasopharyngeal Swab     Status: None   Collection Time: 06/30/21  4:16 PM   Specimen: Nasopharyngeal Swab; Nasopharyngeal(NP) swabs in vial transport medium  Result Value Ref Range Status   SARS Coronavirus 2 by RT PCR NEGATIVE NEGATIVE Final    Comment: (NOTE) SARS-CoV-2 target nucleic acids are NOT DETECTED.  The SARS-CoV-2 RNA is generally detectable in upper respiratory specimens during the acute phase of infection. The lowest concentration of SARS-CoV-2 viral copies this assay can detect is 138 copies/mL. A negative result does not preclude SARS-Cov-2 infection and should not be used as the sole basis for treatment or other patient management decisions. A negative result may occur with  improper specimen collection/handling, submission of specimen other than nasopharyngeal swab, presence of viral mutation(s)  within the areas targeted by this assay, and inadequate number of viral copies(<138 copies/mL). A negative result must be combined with clinical observations, patient history, and epidemiological information. The expected result is Negative.  Fact Sheet for Patients:  EntrepreneurPulse.com.au  Fact Sheet for Healthcare Providers:  IncredibleEmployment.be  This test is no t yet approved or cleared by the [place] FDA and  has been authorized for detection and/or diagnosis of SARS-CoV-2 by FDA under an Emergency Use Authorization (EUA). This EUA will remain  in effect (meaning this test can be used) for the duration of the COVID-19 declaration under Section 564(b)(1) of the Act, 21 U.S.C.section 360bbb-3(b)(1), unless the authorization is terminated  or revoked sooner.       Influenza A by PCR NEGATIVE NEGATIVE Final   Influenza B by PCR NEGATIVE NEGATIVE Final    Comment: (NOTE) The Xpert Xpress SARS-CoV-2/FLU/RSV plus assay is intended as an aid in the diagnosis of influenza from Nasopharyngeal swab specimens and should not be used as a sole basis for treatment. Nasal washings and aspirates are unacceptable for Xpert Xpress SARS-CoV-2/FLU/RSV testing.  Fact Sheet for Patients: EntrepreneurPulse.com.au  Fact Sheet for Healthcare Providers: IncredibleEmployment.be  This test is not yet approved or cleared by the [place] FDA and has been authorized for detection and/or diagnosis of SARS-CoV-2 by FDA under an Emergency Use Authorization (EUA). This EUA will remain in effect (meaning this test can be used) for the duration of the COVID-19 declaration under Section 564(b)(1) of the Act, 21 U.S.C. section 360bbb-3(b)(1), unless the authorization is terminated or revoked.  Performed at [facility], [place] [address] [place], [place] [address]   Blood culture (routine x 2)     Status: None  (Preliminary result)   Collection Time: 06/30/21  4:16 PM   Specimen: BLOOD  Result Value Ref Range Status   Specimen Description BLOOD BLOOD RIGHT FOREARM  Final   Special Requests   Final    BOTTLES DRAWN AEROBIC AND ANAEROBIC Blood Culture results may not be optimal due to an inadequate volume of blood received in culture bottles   Culture   Final    NO GROWTH < 12 HOURS Performed at [place] [address], [place], [place] [address]    Report Status PENDING  Incomplete    Procedures and diagnostic studies:  DG Chest 1 View  Result Date: 06/30/2021 CLINICAL DATA:  Sepsis, hematuria, dementia, increased agitation EXAM: CHEST  1 VIEW COMPARISON:  Portable exam 1652 hours without priors for comparison FINDINGS: Normal heart size, mediastinal contours, and pulmonary vascularity. Atherosclerotic calcification aorta. Lungs clear. No pulmonary infiltrate, pleural effusion, or pneumothorax. Osseous structures unremarkable. Intraspinal stimulator and postsurgical changes of cervical fusion noted. IMPRESSION: No acute abnormalities. Aortic Atherosclerosis (ICD10-I70.0). Electronically Signed   By: Lavonia Dana M.D.   On: 06/30/2021 17:03   CT Renal Stone Study  Result Date: 06/30/2021 CLINICAL DATA:  Hematuria EXAM: CT ABDOMEN AND PELVIS WITHOUT CONTRAST TECHNIQUE: Multidetector CT imaging of the abdomen and pelvis was performed following the standard protocol without IV contrast. COMPARISON:  None. FINDINGS: Lower chest: Lung bases are clear. Hepatobiliary: Unenhanced liver is unremarkable. Gallbladder is unremarkable. No intrahepatic or extrahepatic ductal dilatation. Pancreas: Within normal limits. Spleen: Within normal limits. Adrenals/Urinary Tract: Adrenal glands are within normal limits. Punctate nonobstructing left lower pole renal calculus (series 3/image 46). Right kidney is within normal limits. No hydronephrosis. Bladder is mildly thick-walled although underdistended.  Stomach/Bowel: Stomach is within normal limits. No evidence of bowel obstruction. Appendix is not discretely visualized. Mild left colonic diverticulosis, without evidence of diverticulitis. Vascular/Lymphatic: No evidence of abdominal aortic aneurysm. Atherosclerotic calcifications of the abdominal aorta and branch vessels. Retroaortic left renal vein. No suspicious abdominopelvic lymphadenopathy. Reproductive: Prostate is unremarkable. Other: No abdominopelvic ascites. Small fat containing periumbilical hernia (series 3/image 68). Musculoskeletal: Thoracic spine stimulator. Degenerative changes of the visualized thoracolumbar spine. 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 IMPRESSION: Punctate nonobstructing left lower pole renal calculus. No hydronephrosis. Thick-walled bladder, although underdistended. Electronically Signed   By: Julian Hy M.D.   On: 06/30/2021 03:13               LOS: 1 day   Cole Harris  [facility]   Pager on [email or web address]. If 7PM-7AM, please contact night-coverage at [email or web address]     07/01/2021, 3:41 PM

## 2021-07-02 LAB — CBC WITH DIFFERENTIAL/PLATELET
Abs Immature Granulocytes: 0.06 10*3/uL (ref 0.00–0.07)
Basophils Absolute: 0 10*3/uL (ref 0.0–0.1)
Basophils Relative: 0 %
Eosinophils Absolute: 0 10*3/uL (ref 0.0–0.5)
Eosinophils Relative: 0 %
HCT: 40.8 % (ref 39.0–52.0)
Hemoglobin: 13.8 g/dL (ref 13.0–17.0)
Immature Granulocytes: 1 %
Lymphocytes Relative: 11 %
Lymphs Abs: 1.3 10*3/uL (ref 0.7–4.0)
MCH: 32.7 pg (ref 26.0–34.0)
MCHC: 33.8 g/dL (ref 30.0–36.0)
MCV: 96.7 fL (ref 80.0–100.0)
Monocytes Absolute: 0.6 10*3/uL (ref 0.1–1.0)
Monocytes Relative: 5 %
Neutro Abs: 9.8 10*3/uL — ABNORMAL HIGH (ref 1.7–7.7)
Neutrophils Relative %: 83 %
Platelets: 167 10*3/uL (ref 150–400)
RBC: 4.22 MIL/uL (ref 4.22–5.81)
RDW: 12.9 % (ref 11.5–15.5)
WBC: 11.8 10*3/uL — ABNORMAL HIGH (ref 4.0–10.5)
nRBC: 0 % (ref 0.0–0.2)

## 2021-07-02 LAB — BASIC METABOLIC PANEL
Anion gap: 8 (ref 5–15)
BUN: 9 mg/dL (ref 8–23)
CO2: 27 mmol/L (ref 22–32)
Calcium: 9.1 mg/dL (ref 8.9–10.3)
Chloride: 100 mmol/L (ref 98–111)
Creatinine, Ser: 0.94 mg/dL (ref 0.61–1.24)
GFR, Estimated: >60 mL/min (ref >60)
Glucose, Bld: 158 mg/dL — ABNORMAL HIGH (ref 70–99)
Potassium: 3.8 mmol/L (ref 3.5–5.1)
Sodium: 135 mmol/L (ref 135–145)

## 2021-07-02 LAB — GLUCOSE, CAPILLARY
Glucose-Capillary: 187 mg/dL — ABNORMAL HIGH (ref 70–99)
Glucose-Capillary: 139 mg/dL — ABNORMAL HIGH (ref 70–99)
Glucose-Capillary: 190 mg/dL — ABNORMAL HIGH (ref 70–99)
Glucose-Capillary: 206 mg/dL — ABNORMAL HIGH (ref 70–99)

## 2021-07-02 LAB — URINE CULTURE: Culture: 60000 — AB

## 2021-07-02 MED ORDER — ACETAMINOPHEN 500 MG PO TABS
1000.0000 mg | ORAL_TABLET | Freq: Once | ORAL | Status: AC
  Administered 2021-07-02: 1000 mg via ORAL
  Filled 2021-07-02: qty 2

## 2021-07-02 NOTE — Progress Notes (Signed)
Patient cannot wear cpap with mittens on.  We will try again tomorrow night if patient is out of mittens.

## 2021-07-02 NOTE — Progress Notes (Signed)
Pt has mittens on and family states pt will not wear CPAP through the night. RT will cont to monitor. Pt is on RA sat 95 RT will cont to monitor.

## 2021-07-02 NOTE — Progress Notes (Signed)
MD paged for increase Tylenol order for temp 103.3 ax. Ice packs placed to core at this time. Awaiting MD return call or new order

## 2021-07-02 NOTE — Progress Notes (Addendum)
Progress Note    Cole Harris  EHM:[id number] DOB: 09/03/1945  DOA: 06/29/2021 PCP: Eustaquio Boyden, MD      Brief Narrative:    Medical records reviewed and are as summarized below:  Cole Harris is a 75 y.o. male with medical history significant for dementia, type 2 diabetes mellitus, CAD, hypertension, pulmonary hypertension, OSA (but does not use his CPAP) who was brought to the hospital because of abdominal pain, groin pain, bloody urine, increasing confusion and screaming with urination.  He was found to have severe sepsis secondary to acute UTI.    Assessment/Plan:   Principal Problem:   Sepsis (HCC) Active Problems:   Alzheimer's disease with early onset (HCC)   CAD (coronary artery disease)   Dyslipidemia associated with type 2 diabetes mellitus (HCC)   Hypothyroidism   GERD (gastroesophageal reflux disease)   Acute lower UTI   Severe sepsis secondary to acute UTI with hematuria, leukocytosis: He had fever with temperature of 103.3 F this morning.  Urine culture showed E. coli.  No growth on blood cultures thus far.  Continue IV ceftriaxone.  Acute metabolic encephalopathy with underlying dementia: Improved.  Continue psychotropics and supportive care.   Nonobstructing left renal calculus: Outpatient follow-up with urologist  Type II DM: Metformin on hold.  NovoLog as needed for hyperglycemia.  OSA: CPAP nightly as able.   History of TENS unit placement in the right lower quadrant  Diet Order             Diet Carb Modified Fluid consistency: Thin; Room service appropriate? Yes  Diet effective now                      Consultants: None  Procedures: None    Medications:    buPROPion  150 mg Oral Daily   divalproex  125 mg Oral Daily   divalproex  250 mg Oral QHS   memantine  28 mg Oral QHS   And   donepezil  10 mg Oral QHS   enoxaparin (LOVENOX) injection  40 mg Subcutaneous Q24H   escitalopram  20 mg Oral Daily    insulin aspart  0-5 Units Subcutaneous QHS   insulin aspart  0-9 Units Subcutaneous TID WC   pantoprazole  40 mg Oral Daily   risperiDONE  0.5 mg Oral QHS   vitamin B-12  1,000 mcg Oral Daily   Continuous Infusions:  cefTRIAXone (ROCEPHIN)  IV 2 g (07/02/21 1004)     Anti-infectives (From admission, onward)    Start     Dose/Rate Route Frequency Ordered Stop   07/01/21 1000  cefTRIAXone (ROCEPHIN) 2 g in sodium chloride 0.9 % 100 mL IVPB        2 g 200 mL/hr over 30 Minutes Intravenous Every 24 hours 06/30/21 1936     07/01/21 0000  cefTRIAXone (ROCEPHIN) 2 g in sodium chloride 0.9 % 100 mL IVPB  Status:  Discontinued        2 g 200 mL/hr over 30 Minutes Intravenous Every 24 hours 06/30/21 1928 06/30/21 1936   06/30/21 1515  cefTRIAXone (ROCEPHIN) 2 g in sodium chloride 0.9 % 100 mL IVPB        2 g 200 mL/hr over 30 Minutes Intravenous  Once 06/30/21 1501 06/30/21 1659              Family Communication/Anticipated D/C date and plan/Code Status   DVT prophylaxis: enoxaparin (LOVENOX) injection 40 mg Start: 06/30/21 1930  Code Status: Full Code  Family Communication: [name], son, at the bedside Disposition Plan: Discharge to home in 2 to 3 days   Status is: Inpatient  Remains inpatient appropriate because: Sepsis on IV antibiotics           Subjective:   Interval events noted.  He had fever with temperature of 103.3 F this morning.  Mental status is better.  His son was at the bedside.  Objective:    Vitals:   07/02/21 0118 07/02/21 0525 07/02/21 0535 07/02/21 0958  BP:   130/75   Pulse:   (!) 132   Resp:  (!) 22 16   Temp: 99.9 F (37.7 C) (!) 103.3 F (39.6 C)  99.3 F (37.4 C)  TempSrc: Axillary Axillary  Oral  SpO2:   95%    No data found.   Intake/Output Summary (Last 24 hours) at 07/02/2021 1241 Last data filed at 07/02/2021 0016 Gross per 24 hour  Intake --  Output 250 ml  Net -250 ml   There were no vitals filed for this  visit.  Exam:  GEN: NAD SKIN: Warm and dry EYES: EOMI ENT: MMM CV: RRR PULM: CTA B ABD: soft, obese, NT, +BS.  Lump/mass felt in the right lower quadrant (his son attributed this to TENS unit) CNS: AAO x 1 (person), non focal EXT: No edema or tenderness        Data Reviewed:   I have personally reviewed following labs and imaging studies:  Labs: Labs show the following:   Basic Metabolic Panel: Recent Labs  Lab 06/30/21 0014 07/01/21 0342 07/02/21 0243  NA 133* 132* 135  K 3.9 3.9 3.8  CL 101 102 100  CO2 23 21* 27  GLUCOSE 141* 128* 158*  BUN 13 8 9   CREATININE 0.85 0.79 0.94  CALCIUM 9.7 9.1 9.1   GFR Estimated Creatinine Clearance: 87.4 mL/min (by C-G formula based on SCr of 0.94 mg/dL). Liver Function Tests: Recent Labs  Lab 06/30/21 0014  AST 16  ALT 16  ALKPHOS 43  BILITOT 0.6  PROT 7.1  ALBUMIN 3.7   No results for input(s): LIPASE, AMYLASE in the last 168 hours. No results for input(s): AMMONIA in the last 168 hours. Coagulation profile No results for input(s): INR, PROTIME in the last 168 hours.  CBC: Recent Labs  Lab 06/30/21 0014 07/01/21 0342 07/02/21 0243  WBC 19.1* 20.5* 11.8*  NEUTROABS 15.5*  --  9.8*  HGB 15.2 13.8 13.8  HCT 44.8 41.9 40.8  MCV 95.7 100.2* 96.7  PLT 218 154 167   Cardiac Enzymes: No results for input(s): CKTOTAL, CKMB, CKMBINDEX, TROPONINI in the last 168 hours. BNP (last 3 results) No results for input(s): PROBNP in the last 8760 hours. CBG: Recent Labs  Lab 07/01/21 0831 07/01/21 1251 07/01/21 2051 07/02/21 0735 07/02/21 1127  GLUCAP 128* 145* 133* 187* 139*   D-Dimer: No results for input(s): DDIMER in the last 72 hours. Hgb A1c: No results for input(s): HGBA1C in the last 72 hours. Lipid Profile: No results for input(s): CHOL, HDL, LDLCALC, TRIG, CHOLHDL, LDLDIRECT in the last 72 hours. Thyroid function studies: No results for input(s): TSH, T4TOTAL, T3FREE, THYROIDAB in the last 72  hours.  Invalid input(s): FREET3 Anemia work up: No results for input(s): VITAMINB12, FOLATE, FERRITIN, TIBC, IRON, RETICCTPCT in the last 72 hours. Sepsis Labs: Recent Labs  Lab 06/30/21 0014 06/30/21 1616 06/30/21 1931 07/01/21 0342 07/02/21 0243  WBC 19.1*  --   --  20.5* 11.8*  LATICACIDVEN  --  1.9 2.0*  --   --     Microbiology Recent Results (from the past 240 hour(s))  Urine Culture     Status: Abnormal   Collection Time: 06/29/21 11:54 PM   Specimen: Urine, Clean Catch  Result Value Ref Range Status   Specimen Description URINE, CLEAN CATCH  Final   Special Requests   Final    NONE Performed at [facility], [address], [place], [place] [address]    Culture 60,000 COLONIES/mL ESCHERICHIA COLI (A)  Final   Report Status 07/02/2021 FINAL  Final   Organism ID, Bacteria ESCHERICHIA COLI (A)  Final      Susceptibility   Escherichia coli - MIC*    AMPICILLIN <=2 SENSITIVE Sensitive     CEFAZOLIN <=4 SENSITIVE Sensitive     CEFEPIME <=0.12 SENSITIVE Sensitive     CEFTRIAXONE <=0.25 SENSITIVE Sensitive     CIPROFLOXACIN <=0.25 SENSITIVE Sensitive     GENTAMICIN <=1 SENSITIVE Sensitive     IMIPENEM <=0.25 SENSITIVE Sensitive     NITROFURANTOIN <=16 SENSITIVE Sensitive     TRIMETH/SULFA <=20 SENSITIVE Sensitive     AMPICILLIN/SULBACTAM <=2 SENSITIVE Sensitive     PIP/TAZO <=4 SENSITIVE Sensitive     * 60,000 COLONIES/mL ESCHERICHIA COLI  Blood culture (routine x 2)     Status: None (Preliminary result)   Collection Time: 06/30/21  3:15 PM   Specimen: BLOOD LEFT WRIST  Result Value Ref Range Status   Specimen Description BLOOD LEFT WRIST  Final   Special Requests   Final    BOTTLES DRAWN AEROBIC AND ANAEROBIC Blood Culture results may not be optimal due to an inadequate volume of blood received in culture bottles   Culture   Final    NO GROWTH 2 DAYS Performed at [facility], [address], [place], [place] [address]    Report Status PENDING   Incomplete  Resp Panel by RT-PCR (Flu A&B, Covid) Nasopharyngeal Swab     Status: None   Collection Time: 06/30/21  4:16 PM   Specimen: Nasopharyngeal Swab; Nasopharyngeal(NP) swabs in vial transport medium  Result Value Ref Range Status   SARS Coronavirus 2 by RT PCR NEGATIVE NEGATIVE Final    Comment: (NOTE) SARS-CoV-2 target nucleic acids are NOT DETECTED.  The SARS-CoV-2 RNA is generally detectable in upper respiratory specimens during the acute phase of infection. The lowest concentration of SARS-CoV-2 viral copies this assay can detect is 138 copies/mL. A negative result does not preclude SARS-Cov-2 infection and should not be used as the sole basis for treatment or other patient management decisions. A negative result may occur with  improper specimen collection/handling, submission of specimen other than nasopharyngeal swab, presence of viral mutation(s) within the areas targeted by this assay, and inadequate number of viral copies(<138 copies/mL). A negative result must be combined with clinical observations, patient history, and epidemiological information. The expected result is Negative.  Fact Sheet for Patients:  EntrepreneurPulse.com.au  Fact Sheet for Healthcare Providers:  IncredibleEmployment.be  This test is no t yet approved or cleared by the [place] FDA and  has been authorized for detection and/or diagnosis of SARS-CoV-2 by FDA under an Emergency Use Authorization (EUA). This EUA will remain  in effect (meaning this test can be used) for the duration of the COVID-19 declaration under Section 564(b)(1) of the Act, 21 U.S.C.section 360bbb-3(b)(1), unless the authorization is terminated  or revoked sooner.  Influenza A by PCR NEGATIVE NEGATIVE Final   Influenza B by PCR NEGATIVE NEGATIVE Final    Comment: (NOTE) The Xpert Xpress SARS-CoV-2/FLU/RSV plus assay is intended as an aid in the diagnosis of influenza  from Nasopharyngeal swab specimens and should not be used as a sole basis for treatment. Nasal washings and aspirates are unacceptable for Xpert Xpress SARS-CoV-2/FLU/RSV testing.  Fact Sheet for Patients: EntrepreneurPulse.com.au  Fact Sheet for Healthcare Providers: IncredibleEmployment.be  This test is not yet approved or cleared by the [place] FDA and has been authorized for detection and/or diagnosis of SARS-CoV-2 by FDA under an Emergency Use Authorization (EUA). This EUA will remain in effect (meaning this test can be used) for the duration of the COVID-19 declaration under Section 564(b)(1) of the Act, 21 U.S.C. section 360bbb-3(b)(1), unless the authorization is terminated or revoked.  Performed at [facility], [place] [address], [place], [place] [address]   Blood culture (routine x 2)     Status: None (Preliminary result)   Collection Time: 06/30/21  4:16 PM   Specimen: BLOOD  Result Value Ref Range Status   Specimen Description BLOOD BLOOD RIGHT FOREARM  Final   Special Requests   Final    BOTTLES DRAWN AEROBIC AND ANAEROBIC Blood Culture results may not be optimal due to an inadequate volume of blood received in culture bottles   Culture   Final    NO GROWTH 2 DAYS Performed at [facility], [place] [address] [place], [place] [address]    Report Status PENDING  Incomplete    Procedures and diagnostic studies:  DG Chest 1 View  Result Date: 06/30/2021 CLINICAL DATA:  Sepsis, hematuria, dementia, increased agitation EXAM: CHEST  1 VIEW COMPARISON:  Portable exam 1652 hours without priors for comparison FINDINGS: Normal heart size, mediastinal contours, and pulmonary vascularity. Atherosclerotic calcification aorta. Lungs clear. No pulmonary infiltrate, pleural effusion, or pneumothorax. Osseous structures unremarkable. Intraspinal stimulator and postsurgical changes of cervical fusion noted. IMPRESSION: No acute  abnormalities. Aortic Atherosclerosis (ICD10-I70.0). Electronically Signed   By: Lavonia Dana M.D.   On: 06/30/2021 17:03               LOS: 2 days   Bee Hammerschmidt  [facility]   Pager on [email or web address]. If 7PM-7AM, please contact night-coverage at [email or web address]     07/02/2021, 12:41 PM

## 2021-07-02 NOTE — Plan of Care (Signed)

## 2021-07-03 LAB — CBC WITH DIFFERENTIAL/PLATELET
Abs Immature Granulocytes: 0.03 10*3/uL (ref 0.00–0.07)
Basophils Absolute: 0 10*3/uL (ref 0.0–0.1)
Basophils Relative: 1 %
Eosinophils Absolute: 0 10*3/uL (ref 0.0–0.5)
Eosinophils Relative: 1 %
HCT: 38.2 % — ABNORMAL LOW (ref 39.0–52.0)
Hemoglobin: 13.2 g/dL (ref 13.0–17.0)
Immature Granulocytes: 1 %
Lymphocytes Relative: 22 %
Lymphs Abs: 1.2 10*3/uL (ref 0.7–4.0)
MCH: 32.5 pg (ref 26.0–34.0)
MCHC: 34.6 g/dL (ref 30.0–36.0)
MCV: 94.1 fL (ref 80.0–100.0)
Monocytes Absolute: 0.5 10*3/uL (ref 0.1–1.0)
Monocytes Relative: 9 %
Neutro Abs: 3.7 10*3/uL (ref 1.7–7.7)
Neutrophils Relative %: 66 %
Platelets: 185 10*3/uL (ref 150–400)
RBC: 4.06 MIL/uL — ABNORMAL LOW (ref 4.22–5.81)
RDW: 12.6 % (ref 11.5–15.5)
WBC: 5.4 10*3/uL (ref 4.0–10.5)
nRBC: 0 % (ref 0.0–0.2)

## 2021-07-03 LAB — GLUCOSE, CAPILLARY
Glucose-Capillary: 142 mg/dL — ABNORMAL HIGH (ref 70–99)
Glucose-Capillary: 207 mg/dL — ABNORMAL HIGH (ref 70–99)
Glucose-Capillary: 200 mg/dL — ABNORMAL HIGH (ref 70–99)
Glucose-Capillary: 192 mg/dL — ABNORMAL HIGH (ref 70–99)

## 2021-07-03 NOTE — Plan of Care (Signed)
Problem: Education: Goal: Knowledge of General Education information will improve Description: Including pain rating scale, medication(s)/side effects and non-pharmacologic comfort measures Outcome: Progressing   Problem: Health Behavior/Discharge Planning: Goal: Ability to manage health-related needs will improve Outcome: Progressing   Problem: Nutrition: Goal: Adequate nutrition will be maintained Outcome: Progressing   Problem: Elimination: Goal: Will not experience complications related to urinary retention Outcome: Progressing   Problem: Pain Managment: Goal: General experience of comfort will improve Outcome: Progressing   Problem: Safety: Goal: Ability to remain free from injury will improve Outcome: Progressing   Problem: Skin Integrity: Goal: Risk for impaired skin integrity will decrease Outcome: Progressing   

## 2021-07-03 NOTE — Plan of Care (Signed)
  Problem: Activity: Goal: Risk for activity intolerance will decrease Outcome: Progressing   Problem: Nutrition: Goal: Adequate nutrition will be maintained Outcome: Progressing   Problem: Coping: Goal: Level of anxiety will decrease Outcome: Progressing   Problem: Elimination: Goal: Will not experience complications related to bowel motility Outcome: Progressing   Problem: Pain Managment: Goal: General experience of comfort will improve Outcome: Progressing   

## 2021-07-03 NOTE — Progress Notes (Signed)
PROGRESS NOTE    Cole Harris  [id number] DOB: [birth year] DOA: 06/29/2021 PCP: Ria Bush, MD   Chief Complaint  Patient presents with   Hematuria   Brief Narrative/Hospital Course:  Cole Harris, 75 y.o. male with PMH of dementia, type 2 diabetes mellitus, CAD, hypertension, pulmonary hypertension, OSA (but does not use his CPAP) who was brought to the hospital because of abdominal pain, groin pain, bloody urine, increasing confusion and screaming with urination. Patient was found to have sepsis with fever of 103 BUN 13 heart rate 94 respiration 22, leukocytosis 20.5 lactic acidosis due to E. coli UTI   Subjective: Seen this morning patient sleeping, son at the bedside also resting.  Both mucopurulent patient does not answer any questions appears to be sleepy.  Son reports patient at this time is stable mentally  Assessment & Plan:  Sepsis POA due to E. coli UTI: Continue ceftriaxone blood culture no growth so far, last fever spike 11/3 leukocytosis nicely resolved.  If remains afebrile and stable can de-escalate to oral antibiotics tomorrow and discharge.  Acute metabolic encephalopathy due to sepsis in the setting of dementia Alzheimer's disease with early onset:  with ongoing agitation needing mitten.  Son reports patient is not far from his baseline.  He is ambulatory at home and son is with him all the time.Continue delirium precaution fall precaution, mitten for line protection.  Consult PT OT  Continue his Aricept, Lexapro, Risperdal, Depakote, Namenda and Wellbutrin  Punctuate nonobstructive left lower pole renal calculus without hydronephrosis- outpatient follow-up  Type 2 diabetes fairly stable, continue to hold metformin And sliding scale insulin.  Last A1c 7.0 October 22-stable.  Recent Labs  Lab 07/01/21 2051 07/02/21 0735 07/02/21 1127 07/02/21 1648 07/02/21 2036  GLUCAP 133* 187* 139* 190* 206*    OSA : on CPAP nightly refused last  night. History of TENS placement in the right lower quadrant  GERD-Cont ppi  DVT prophylaxis: enoxaparin (LOVENOX) injection 40 mg Start: 06/30/21 1930 Code Status:   Code Status: Full Code Family Communication: plan of care discussed with patient's son  at bedside. Status is: Inpatient  Remains inpatient appropriate because: For ongoing management of sepsis UTI. Anticipating discharged home over the weekend pending PT OT  Objective: Vitals last 24 hrs: Vitals:   07/02/21 1724 07/02/21 2104 07/03/21 0000 07/03/21 0638  BP:  (!) 107/94  127/76  Pulse:  72  71  Resp:  18  16  Temp: (!) 101.6 F (38.7 C) 98.5 F (36.9 C) 99.6 F (37.6 C) 100.1 F (37.8 C)  TempSrc: Oral Oral Oral Oral  SpO2:  95%  91%   Weight change:   Intake/Output Summary (Last 24 hours) at 07/03/2021 [id number] Last data filed at 07/02/2021 1830 Gross per 24 hour  Intake 1068 ml  Output 650 ml  Net 418 ml   Net IO Since Admission: 268 mL [07/03/21 0714]   Physical Examination: General exam: Aax1, weak,older than stated age. HEENT:Oral mucosa moist, Ear/Nose WNL grossly,dentition normal. Respiratory system: B/l diminished BS, no use of accessory muscle, non tender. Cardiovascular system: S1 & S2 +,No JVD. Gastrointestinal system: Abdomen soft, NT,ND, BS+. Nervous System:Alert, awake, moving extremities. Extremities: edema none, distal peripheral pulses palpable.  Skin: No rashes, no icterus. MSK: Normal muscle bulk, tone, power.  Medications reviewed:  Scheduled Meds:  buPROPion  150 mg Oral Daily   divalproex  125 mg Oral Daily   divalproex  250 mg Oral QHS   memantine  28 mg  Oral QHS   And   donepezil  10 mg Oral QHS   enoxaparin (LOVENOX) injection  40 mg Subcutaneous Q24H   escitalopram  20 mg Oral Daily   insulin aspart  0-5 Units Subcutaneous QHS   insulin aspart  0-9 Units Subcutaneous TID WC   pantoprazole  40 mg Oral Daily   risperiDONE  0.5 mg Oral QHS   vitamin B-12  1,000 mcg Oral  Daily   Continuous Infusions:  cefTRIAXone (ROCEPHIN)  IV 2 g (07/02/21 1004)    Diet Order             Diet Carb Modified Fluid consistency: Thin; Room service appropriate? Yes  Diet effective now                          Weight change:   Wt Readings from Last 3 Encounters:  06/22/21 117.9 kg  12/05/20 115.4 kg  04/11/20 112.5 kg     Consultants:see note  Procedures:see note Antimicrobials: Anti-infectives (From admission, onward)    Start     Dose/Rate Route Frequency Ordered Stop   07/01/21 1000  cefTRIAXone (ROCEPHIN) 2 g in sodium chloride 0.9 % 100 mL IVPB        2 g 200 mL/hr over 30 Minutes Intravenous Every 24 hours 06/30/21 1936     07/01/21 0000  cefTRIAXone (ROCEPHIN) 2 g in sodium chloride 0.9 % 100 mL IVPB  Status:  Discontinued        2 g 200 mL/hr over 30 Minutes Intravenous Every 24 hours 06/30/21 1928 06/30/21 1936   06/30/21 1515  cefTRIAXone (ROCEPHIN) 2 g in sodium chloride 0.9 % 100 mL IVPB        2 g 200 mL/hr over 30 Minutes Intravenous  Once 06/30/21 1501 06/30/21 1659      Culture/Microbiology    Component Value Date/Time   SDES BLOOD BLOOD RIGHT FOREARM 06/30/2021 1616   SPECREQUEST  06/30/2021 1616    BOTTLES DRAWN AEROBIC AND ANAEROBIC Blood Culture results may not be optimal due to an inadequate volume of blood received in culture bottles   CULT  06/30/2021 1616    NO GROWTH 2 DAYS Performed at [facility], [address] [place], [place] [address]    REPTSTATUS PENDING 06/30/2021 1616    Other culture-see note  Unresulted Labs (From admission, onward)    None      Data Reviewed: I have personally reviewed following labs and imaging studies CBC: Recent Labs  Lab 06/30/21 0014 07/01/21 0342 07/02/21 0243 07/03/21 0236  WBC 19.1* 20.5* 11.8* 5.4  NEUTROABS 15.5*  --  9.8* 3.7  HGB 15.2 13.8 13.8 13.2  HCT 44.8 41.9 40.8 38.2*  MCV 95.7 100.2* 96.7 94.1  PLT 218 154 167 185   Basic Metabolic  Panel: Recent Labs  Lab 06/30/21 0014 07/01/21 0342 07/02/21 0243  NA 133* 132* 135  K 3.9 3.9 3.8  CL 101 102 100  CO2 23 21* 27  GLUCOSE 141* 128* 158*  BUN 13 8 9   CREATININE 0.85 0.79 0.94  CALCIUM 9.7 9.1 9.1   GFR: Estimated Creatinine Clearance: 87.4 mL/min (by C-G formula based on SCr of 0.94 mg/dL). Liver Function Tests: Recent Labs  Lab 06/30/21 0014  AST 16  ALT 16  ALKPHOS 43  BILITOT 0.6  PROT 7.1  ALBUMIN 3.7   No results for input(s): LIPASE, AMYLASE in the last 168 hours. No results for input(s):  AMMONIA in the last 168 hours. Coagulation Profile: No results for input(s): INR, PROTIME in the last 168 hours. Cardiac Enzymes: No results for input(s): CKTOTAL, CKMB, CKMBINDEX, TROPONINI in the last 168 hours. BNP (last 3 results) No results for input(s): PROBNP in the last 8760 hours. HbA1C: No results for input(s): HGBA1C in the last 72 hours. CBG: Recent Labs  Lab 07/01/21 2051 07/02/21 0735 07/02/21 1127 07/02/21 1648 07/02/21 2036  GLUCAP 133* 187* 139* 190* 206*   Lipid Profile: No results for input(s): CHOL, HDL, LDLCALC, TRIG, CHOLHDL, LDLDIRECT in the last 72 hours. Thyroid Function Tests: No results for input(s): TSH, T4TOTAL, FREET4, T3FREE, THYROIDAB in the last 72 hours. Anemia Panel: No results for input(s): VITAMINB12, FOLATE, FERRITIN, TIBC, IRON, RETICCTPCT in the last 72 hours. Sepsis Labs: Recent Labs  Lab 06/30/21 1616 06/30/21 1931  LATICACIDVEN 1.9 2.0*    Recent Results (from the past 240 hour(s))  Urine Culture     Status: Abnormal   Collection Time: 06/29/21 11:54 PM   Specimen: Urine, Clean Catch  Result Value Ref Range Status   Specimen Description URINE, CLEAN CATCH  Final   Special Requests   Final    NONE Performed at [facility], [address] [place], [place] [address]    Culture 60,000 COLONIES/mL ESCHERICHIA COLI (A)  Final   Report Status 07/02/2021 FINAL  Final   Organism ID, Bacteria  ESCHERICHIA COLI (A)  Final      Susceptibility   Escherichia coli - MIC*    AMPICILLIN <=2 SENSITIVE Sensitive     CEFAZOLIN <=4 SENSITIVE Sensitive     CEFEPIME <=0.12 SENSITIVE Sensitive     CEFTRIAXONE <=0.25 SENSITIVE Sensitive     CIPROFLOXACIN <=0.25 SENSITIVE Sensitive     GENTAMICIN <=1 SENSITIVE Sensitive     IMIPENEM <=0.25 SENSITIVE Sensitive     NITROFURANTOIN <=16 SENSITIVE Sensitive     TRIMETH/SULFA <=20 SENSITIVE Sensitive     AMPICILLIN/SULBACTAM <=2 SENSITIVE Sensitive     PIP/TAZO <=4 SENSITIVE Sensitive     * 60,000 COLONIES/mL ESCHERICHIA COLI  Blood culture (routine x 2)     Status: None (Preliminary result)   Collection Time: 06/30/21  3:15 PM   Specimen: BLOOD LEFT WRIST  Result Value Ref Range Status   Specimen Description BLOOD LEFT WRIST  Final   Special Requests   Final    BOTTLES DRAWN AEROBIC AND ANAEROBIC Blood Culture results may not be optimal due to an inadequate volume of blood received in culture bottles   Culture   Final    NO GROWTH 2 DAYS Performed at [facility], [address] [place], [place] [address]    Report Status PENDING  Incomplete  Resp Panel by RT-PCR (Flu A&B, Covid) Nasopharyngeal Swab     Status: None   Collection Time: 06/30/21  4:16 PM   Specimen: Nasopharyngeal Swab; Nasopharyngeal(NP) swabs in vial transport medium  Result Value Ref Range Status   SARS Coronavirus 2 by RT PCR NEGATIVE NEGATIVE Final    Comment: (NOTE) SARS-CoV-2 target nucleic acids are NOT DETECTED.  The SARS-CoV-2 RNA is generally detectable in upper respiratory specimens during the acute phase of infection. The lowest concentration of SARS-CoV-2 viral copies this assay can detect is 138 copies/mL. A negative result does not preclude SARS-Cov-2 infection and should not be used as the sole basis for treatment or other patient management decisions. A negative result may occur with  improper specimen collection/handling, submission of specimen  other than nasopharyngeal swab, presence of viral mutation(s) within the areas targeted by this assay, and inadequate number of viral copies(<138 copies/mL). A negative result must be combined with clinical observations, patient history, and epidemiological information. The expected result is Negative.  Fact Sheet for Patients:  EntrepreneurPulse.com.au  Fact Sheet for Healthcare Providers:  IncredibleEmployment.be  This test is no t yet approved or cleared by the [place] FDA and  has been authorized for detection and/or diagnosis of SARS-CoV-2 by FDA under an Emergency Use Authorization (EUA). This EUA will remain  in effect (meaning this test can be used) for the duration of the COVID-19 declaration under Section 564(b)(1) of the Act, 21 U.S.C.section 360bbb-3(b)(1), unless the authorization is terminated  or revoked sooner.       Influenza A by PCR NEGATIVE NEGATIVE Final   Influenza B by PCR NEGATIVE NEGATIVE Final    Comment: (NOTE) The Xpert Xpress SARS-CoV-2/FLU/RSV plus assay is intended as an aid in the diagnosis of influenza from Nasopharyngeal swab specimens and should not be used as a sole basis for treatment. Nasal washings and aspirates are unacceptable for Xpert Xpress SARS-CoV-2/FLU/RSV testing.  Fact Sheet for Patients: EntrepreneurPulse.com.au  Fact Sheet for Healthcare Providers: IncredibleEmployment.be  This test is not yet approved or cleared by the [place] FDA and has been authorized for detection and/or diagnosis of SARS-CoV-2 by FDA under an Emergency Use Authorization (EUA). This EUA will remain in effect (meaning this test can be used) for the duration of the COVID-19 declaration under Section 564(b)(1) of the Act, 21 U.S.C. section 360bbb-3(b)(1), unless the authorization is terminated or revoked.  Performed at [facility], [place] [address] [place],  [place] [address]   Blood culture (routine x 2)     Status: None (Preliminary result)   Collection Time: 06/30/21  4:16 PM   Specimen: BLOOD  Result Value Ref Range Status   Specimen Description BLOOD BLOOD RIGHT FOREARM  Final   Special Requests   Final    BOTTLES DRAWN AEROBIC AND ANAEROBIC Blood Culture results may not be optimal due to an inadequate volume of blood received in culture bottles   Culture   Final    NO GROWTH 2 DAYS Performed at [facility], [place] [address], [place], [place] [address]    Report Status PENDING  Incomplete     Radiology Studies: No results found.   LOS: 3 days   Antonieta Pert, MD [facility]  07/03/2021, 7:14 AM

## 2021-07-03 NOTE — Evaluation (Signed)
Occupational Therapy Evaluation Patient Details Name: Cole Harris MRN: [id number] DOB: 22-Jan-1946 Today's Date: 07/03/2021   History of Present Illness Pt is a 75 y/o male admitted 06/30/21 with abdominal/groin pain, bloody urine and increasing confusion.  Found with severe sepsis due to acute UTI. PMH includes: alzheimers disease, CAD, depression, DM, HTN.   Clinical Impression   PTA patient son reports he was able to transfer and walk around home without assist, some assist for ADLs due to cognitive deficits at baseline. He was admitted for above and is limited by problem list below, including impaired balance, decreased activity tolerance and generalized weakness.  He is only oriented to his name, he requires max multimodal cueing to engage in functional tasks and is highly distractible, with possible visual hallucinations. Overall he requires max assist +2 for bed mobility, mod assist +2 for transfers and up to total assist for self care due to cognitive deficits.   Believe he will best benefit from returning to his own environment, with 24/7 support from family.  Recommend continue OT acutely and after dc home with maximal HH services (OT,PT, aide).  Will follow.      Recommendations for follow up therapy are one component of a multi-disciplinary discharge planning process, led by the attending physician.  Recommendations may be updated based on patient status, additional functional criteria and insurance authorization.   Follow Up Recommendations  Home health OT    Assistance Recommended at Discharge Frequent or constant Supervision/Assistance  Functional Status Assessment  Patient has had a recent decline in their functional status and demonstrates the ability to make significant improvements in function in a reasonable and predictable amount of time.  Equipment Recommendations  None recommended by OT    Recommendations for Other Services       Precautions / Restrictions  Precautions Precautions: Fall Restrictions Weight Bearing Restrictions: No      Mobility Bed Mobility Overal bed mobility: Needs Assistance Bed Mobility: Supine to Sit;Rolling;Sit to Supine Rolling: Max assist;+2 for physical assistance   Supine to sit: Max assist;+2 for physical assistance Sit to supine: Max assist;+2 for physical assistance   General bed mobility comments: mobility limited by cognition and inability to follow direction    Transfers Overall transfer level: Needs assistance Equipment used: 2 person hand held assist Transfers: Sit to/from Stand Sit to Stand: Mod assist;+2 physical assistance;+2 safety/equipment           General transfer comment: increased time and mod assist +2 to power up and steady with poor initation and sequencing      Balance Overall balance assessment: Needs assistance Sitting-balance support: Feet supported Sitting balance-Leahy Scale: Good     Standing balance support: Bilateral upper extremity supported;During functional activity Standing balance-Leahy Scale: Poor Standing balance comment: relies on external support                           ADL either performed or assessed with clinical judgement   ADL Overall ADL's : Needs assistance/impaired     Grooming: Total assistance   Upper Body Bathing: Maximal assistance   Lower Body Bathing: Maximal assistance   Upper Body Dressing : Maximal assistance   Lower Body Dressing: Total assistance;+2 for physical assistance;+2 for safety/equipment   Toilet Transfer: +2 for safety/equipment;+2 for physical assistance;Moderate assistance           Functional mobility during ADLs: Moderate assistance;Maximal assistance;+2 for physical assistance;+2 for safety/equipment General ADL Comments: pt limited  by cognition, weakness     Vision         Perception     Praxis      Pertinent Vitals/Pain Pain Assessment: PAINAD Breathing: normal Negative  Vocalization: none Facial Expression: smiling or inexpressive Body Language: tense, distressed pacing, fidgeting Consolability: no need to console PAINAD Score: 1 Pain Intervention(s): Monitored during session     Hand Dominance     Extremity/Trunk Assessment Upper Extremity Assessment Upper Extremity Assessment: Generalized weakness   Lower Extremity Assessment Lower Extremity Assessment: Defer to PT evaluation   Cervical / Trunk Assessment Cervical / Trunk Assessment: Normal   Communication Communication Communication: Expressive difficulties   Cognition Arousal/Alertness: Awake/alert Behavior During Therapy: WFL for tasks assessed/performed Overall Cognitive Status: History of cognitive impairments - at baseline                                 General Comments: pt highly distractable and requires constant cueing, poor ability to follow simple commands.  oriented to name only.     General Comments  son present and supportive    Exercises     Shoulder Instructions      Home Living Family/patient expects to be discharged to:: Private residence Living Arrangements: Spouse/significant other;Children Available Help at Discharge: Family;Available 24 hours/day Type of Home: House Home Access: Stairs to enter [employer] of Steps: 4-8 Entrance Stairs-Rails: Right;Left Home Layout: Two level;Able to live on main level with bedroom/bathroom Alternate Level Stairs-Number of Steps: flight   Bathroom Shower/Tub: Producer, television/film/video: Standard     Home Equipment: Other (comment);Educational psychologist (4 wheels);BSC;Grab bars - tub/shower   Additional Comments: son works from home      Prior Functioning/Environment Prior Level of Function : Needs assist  Cognitive Assist : Mobility (cognitive);ADLs (cognitive) Mobility (Cognitive): Intermittent cues ADLs (Cognitive): Intermittent cues Physical Assist : Mobility (physical);ADLs  (physical) Mobility (physical): Bed mobility;Transfers;Gait;Stairs ADLs (physical): Grooming;Bathing;Dressing;IADLs;Toileting Mobility Comments: patient can normally ambulate without AD in the home ADLs Comments: requires some assist for bathing, dressing at baseline        OT Problem List: Decreased strength;Decreased activity tolerance;Impaired balance (sitting and/or standing);Decreased cognition;Decreased safety awareness      OT Treatment/Interventions: Self-care/ADL training;Patient/family education;Therapeutic activities    OT Goals(Current goals can be found in the care plan section) Acute Rehab OT Goals Patient Stated Goal: to get him home OT Goal Formulation: With patient Time For Goal Achievement: 07/17/21 Potential to Achieve Goals: Good  OT Frequency: Min 2X/week   Barriers to D/C:            Co-evaluation PT/OT/SLP Co-Evaluation/Treatment: Yes Reason for Co-Treatment: For patient/therapist safety;To address functional/ADL transfers PT goals addressed during session: Mobility/safety with mobility;Balance OT goals addressed during session: ADL's and self-care      AM-PAC OT "6 Clicks" Daily Activity     Outcome Measure Help from another person eating meals?: A Lot Help from another person taking care of personal grooming?: A Lot Help from another person toileting, which includes using toliet, bedpan, or urinal?: A Lot Help from another person bathing (including washing, rinsing, drying)?: A Lot Help from another person to put on and taking off regular upper body clothing?: A Lot Help from another person to put on and taking off regular lower body clothing?: A Lot 6 Click Score: 12   End of Session Equipment Utilized During Treatment: Gait belt Nurse Communication: Mobility status  Activity  Tolerance: Patient tolerated treatment well Patient left: in bed;with call bell/phone within reach;with bed alarm set;with family/visitor present  OT Visit Diagnosis:  Other abnormalities of gait and mobility (R26.89);Muscle weakness (generalized) (M62.81)                Time: 6720-9470 OT Time Calculation (min): 42 min Charges:  OT General Charges $OT Visit: 1 Visit OT Evaluation $OT Eval Moderate Complexity: 1 Mod OT Treatments $Self Care/Home Management : 8-22 mins  Barry Brunner, OT Acute Rehabilitation Services Pager [phone] Office [phone]   Chancy Milroy 07/03/2021, 1:06 PM

## 2021-07-03 NOTE — Evaluation (Addendum)
Physical Therapy Evaluation Patient Details Name: Cole Harris MRN: [id number] DOB: [birth year] Today's Date: 07/03/2021  History of Present Illness  Pt is a 75 y/o male admitted 06/30/21 with abdominal/groin pain, bloody urine and increasing confusion.  Found with severe sepsis due to acute UTI. PMH includes: alzheimers disease, CAD, depression, DM, HTN.   Clinical Impression  Patient received in bed, initially seeming quite alert, oriented. As session progressed, patient began speaking in more garbled way and non-sensical. Patient requires max/repeated cues and +2 assist for safe mobility. He does not initiate much movement and is difficult to direct. Patient easily distracted by environment and possible visual hallucinations. He is demonstrating weakness and requiring more assistance than at baseline. Patient will continue to benefit from skilled PT while here to improve mobility, independence and safety.  Son wants to take him home with additional assistance from [facility] at this time.         Recommendations for follow up therapy are one component of a multi-disciplinary discharge planning process, led by the attending physician.  Recommendations may be updated based on patient status, additional functional criteria and insurance authorization.  Follow Up Recommendations Home health PT    Assistance Recommended at Discharge Frequent or constant Supervision/Assistance  Functional Status Assessment Patient has had a recent decline in their functional status and demonstrates the ability to make significant improvements in function in a reasonable and predictable amount of time.  Equipment Recommendations  None recommended by PT    Recommendations for Other Services       Precautions / Restrictions Precautions Precautions: Fall Restrictions Weight Bearing Restrictions: No      Mobility  Bed Mobility Overal bed mobility: Needs Assistance Bed Mobility: Supine to Sit;Rolling;Sit to  Supine Rolling: Max assist;+2 for physical assistance   Supine to sit: Max assist;+2 for physical assistance Sit to supine: Max assist;+2 for physical assistance   General bed mobility comments: mobility limited by cognition and inability to follow direction    Transfers Overall transfer level: Needs assistance Equipment used: 2 person hand held assist Transfers: Sit to/from Stand Sit to Stand: Mod assist;+2 physical assistance;+2 safety/equipment           General transfer comment: increased time and mod assist +2 to power up and steady with poor initation and sequencing    Ambulation/Gait Ambulation/Gait assistance: Mod assist;+2 physical assistance Gait Distance (Feet): 6 Feet Assistive device: 2 person hand held assist Gait Pattern/deviations: Step-to pattern;Decreased step length - right;Decreased step length - left;Trunk flexed;Decreased stride length Gait velocity: decr   General Gait Details: patient required max cues to attempt to walk to sink. Ultimately did not make it all the way to sink due to poor cognition. He is easily distracted and difficult to re-direct.  Stairs            Wheelchair Mobility    Modified Rankin (Stroke Patients Only)       Balance Overall balance assessment: Needs assistance Sitting-balance support: Feet supported Sitting balance-Leahy Scale: Good     Standing balance support: Bilateral upper extremity supported;During functional activity Standing balance-Leahy Scale: Poor Standing balance comment: relies on external support                             Pertinent Vitals/Pain Pain Assessment: PAINAD Breathing: normal Negative Vocalization: none Facial Expression: smiling or inexpressive Body Language: tense, distressed pacing, fidgeting Consolability: no need to console PAINAD Score: 1 Pain Intervention(s): Monitored during  session    Home Living Family/patient expects to be discharged to:: Private  residence Living Arrangements: Spouse/significant other;Children Available Help at Discharge: Family;Available 24 hours/day Type of Home: House Home Access: Stairs to enter Entrance Stairs-Rails: Right;Left Entrance Stairs-Number of Steps: 4-8 Alternate Level Stairs-Number of Steps: flight Home Layout: Two level;Able to live on main level with bedroom/bathroom Home Equipment: Other (comment);Educational psychologist (4 wheels);BSC;Grab bars - tub/shower Additional Comments: son works from home    Prior Function Prior Level of Function : Needs assist  Cognitive Assist : Mobility (cognitive);ADLs (cognitive) Mobility (Cognitive): Intermittent cues ADLs (Cognitive): Intermittent cues Physical Assist : Mobility (physical);ADLs (physical) Mobility (physical): Bed mobility;Transfers;Gait;Stairs ADLs (physical): Grooming;Bathing;Dressing;IADLs;Toileting Mobility Comments: patient can normally ambulate without AD in the home ADLs Comments: requires some assist for bathing, dressing at baseline     Hand Dominance        Extremity/Trunk Assessment   Upper Extremity Assessment Upper Extremity Assessment: Generalized weakness    Lower Extremity Assessment Lower Extremity Assessment: Defer to PT evaluation    Cervical / Trunk Assessment Cervical / Trunk Assessment: Normal  Communication   Communication: Expressive difficulties  Cognition Arousal/Alertness: Awake/alert Behavior During Therapy: WFL for tasks assessed/performed Overall Cognitive Status: History of cognitive impairments - at baseline                                 General Comments: pt highly distractable and requires constant cueing, poor ability to follow simple commands.  oriented to name only.        General Comments General comments (skin integrity, edema, etc.): son present and supportive    Exercises     Assessment/Plan    PT Assessment Patient needs continued PT services  PT Problem List  Decreased strength;Decreased mobility;Decreased safety awareness;Decreased activity tolerance;Decreased balance;Decreased knowledge of use of DME;Decreased cognition       PT Treatment Interventions Therapeutic activities;Gait training;Therapeutic exercise;Patient/family education;Functional mobility training;Stair training    PT Goals (Current goals can be found in the Care Plan section)  Acute Rehab PT Goals Patient Stated Goal: son would like to bring him back home PT Goal Formulation: With family Time For Goal Achievement: 07/11/21 Potential to Achieve Goals: Good    Frequency Min 2X/week   Barriers to discharge        Co-evaluation PT/OT/SLP Co-Evaluation/Treatment: Yes Reason for Co-Treatment: For patient/therapist safety;To address functional/ADL transfers PT goals addressed during session: Mobility/safety with mobility;Balance OT goals addressed during session: ADL's and self-care       AM-PAC PT "6 Clicks" Mobility  Outcome Measure Help needed turning from your back to your side while in a flat bed without using bedrails?: A Lot Help needed moving from lying on your back to sitting on the side of a flat bed without using bedrails?: A Lot Help needed moving to and from a bed to a chair (including a wheelchair)?: A Lot Help needed standing up from a chair using your arms (e.g., wheelchair or bedside chair)?: A Lot Help needed to walk in hospital room?: A Lot Help needed climbing 3-5 steps with a railing? : A Lot 6 Click Score: 12    End of Session Equipment Utilized During Treatment: Gait belt Activity Tolerance: Other (comment) (limited by cognitive deficits) Patient left: in bed;with call bell/phone within reach;with bed alarm set;with family/visitor present Nurse Communication: Mobility status PT Visit Diagnosis: Unsteadiness on feet (R26.81);Other abnormalities of gait and mobility (R26.89);Difficulty in walking, not elsewhere  classified (R26.2)    Time:  1572-6203 PT Time Calculation (min) (ACUTE ONLY): 31 min   Charges:   PT Evaluation $PT Eval Moderate Complexity: 1 Mod        Kaja Jackowski, PT, GCS 07/03/21,1:09 PM

## 2021-07-04 DIAGNOSIS — F0280 Dementia in other diseases classified elsewhere without behavioral disturbance: Secondary | ICD-10-CM

## 2021-07-04 DIAGNOSIS — G300 Alzheimer's disease with early onset: Secondary | ICD-10-CM

## 2021-07-04 DIAGNOSIS — E785 Hyperlipidemia, unspecified: Secondary | ICD-10-CM

## 2021-07-04 DIAGNOSIS — E1169 Type 2 diabetes mellitus with other specified complication: Secondary | ICD-10-CM

## 2021-07-04 LAB — GLUCOSE, CAPILLARY
Glucose-Capillary: 164 mg/dL — ABNORMAL HIGH (ref 70–99)
Glucose-Capillary: 147 mg/dL — ABNORMAL HIGH (ref 70–99)

## 2021-07-04 MED ORDER — AMOXICILLIN 500 MG PO CAPS
500.0000 mg | ORAL_CAPSULE | Freq: Two times a day (BID) | ORAL | Status: DC
  Administered 2021-07-04: 500 mg via ORAL
  Filled 2021-07-04 (×2): qty 1

## 2021-07-04 MED ORDER — ACETAMINOPHEN 325 MG PO TABS: 650.0000 mg | ORAL_TABLET | Freq: Four times a day (QID) | ORAL | Status: AC | PRN

## 2021-07-04 MED ORDER — AMOXICILLIN 500 MG PO CAPS: 500.0000 mg | ORAL_CAPSULE | Freq: Two times a day (BID) | ORAL | 0 refills | Status: AC

## 2021-07-04 NOTE — Discharge Summary (Signed)
Physician Discharge Summary  Cole Harris [id number] DOB: 1945-12-07 DOA: 06/29/2021  PCP: Ria Bush, MD  Admit date: 06/29/2021 Discharge date: 07/04/2021  Admitted From: home  Disposition:  home   Recommendations for Outpatient Follow-up and new medication changes:  Follow up with Dr. Danise Mina in 7 to 10 days.  Continue antibiotic therapy with Amoxicillin for 4 more days.   Home Health: yes   Equipment/Devices: na    Discharge Condition: stable  CODE STATUS: full  Diet recommendation:  heart healthy   Brief/Interim Summary: Cole Harris was admitted to the hospital with the working diagnosis of urinary tract infection due to E coli, complicated with sepsis present on admission.    75 yo male with the past medical history of advanced dementia, T2DM, CAD, HTN, and pulmonary HTN who presented with worsening confusion and inguinal pain. On admission he was not able to give detailed history due to cognitive impairment. Per his family last few days leading to his hospitalization he was acting differently and having bilateral groin pain. Because persistent symptoms his son brought him to the hospital. On his initial physical examination he was febrile 101.3, blood pressure 147/65, HR 79, RR19, oxygen saturation 97%, he had dry mucous membranes, lungs were clear to auscultation, heart S1 and S2 present and rhythmic, abdomen soft and non tender, no lower extremity edema, orientated only to self.    Sodium 133, potassium 3.9, chloride 101, bicarb 23, glucose 141, BUN 13, creatinine 0.5, white count 19.1, hemoglobin 15.2, hematocrit 44.8, platelets 218. SARS COVID-19 negative.   Urine analysis specific gravity 1.028, > 50 white cells,> 50 red cells.   Renal CT with punctate nonobstructing left lower pole renal calculus, no hydronephrosis.  Thick walled bladder.   Chest radiograph no infiltrates.   Patient was placed on antibiotic therapy with intravenous ceftriaxone. His urine  culture was positive for E. coli. Patient's symptoms improved.  Physical therapy recommended home health services with home physical therapy.  E. coli urinary tract infection complicated by severe sepsis, and organ damage acute metabolic encephalopathy. Patient was admitted to the medical ward, he was placed on a remote telemetry monitor. Received intravenous antibiotic therapy with ceftriaxone with good response. His urine culture was positive for E. coli, pan sensitive including ampicillin.  Patient's discharge white cell count is 5.4 and his mentation is back to his baseline. Patient was seen by physical therapy, recommended home health services.  For his metabolic encephalopathy he did required haloperidol administration.  2.  Alzheimer's disease, advanced dementia.  Patient will continue with bupropion, divalproex, risperidone, memantine/donepezil and escitalopram.  3.  Nephrolithiasis.  Nonobstructing stone, follow-up with urology as an outpatient.  4.  Type 2 diabetes mellitus/ dyslipidemia.  Uncontrolled with hemoglobin A1c 7.0.  Patient received insulin sliding scale during his hospitalization. At discharge will resume metformin.  Continue with statin therapy.   5.  Obstructive sleep apnea, obesity class II.  Resume home CPAP at discharge.  His calculated BMI is 39.5.  6.  GERD. Continue with antiacid therapy.   7. Hypothyroid. Continue with levothyroxine.   8. HTN, CAD At discharge resume furosemide and irbesartan for blood pressure control.    Discharge Diagnoses:  Principal Problem:   Sepsis (Cole Harris) Active Problems:   Alzheimer's disease with early onset (Cole Harris)   CAD (coronary artery disease)   Dyslipidemia associated with type 2 diabetes mellitus (Cole Harris)   Hypothyroidism   GERD (gastroesophageal reflux disease)   Acute lower UTI    Discharge Instructions  Allergies as of 07/04/2021   No Known Allergies      Medication List     TAKE these medications     1st Medx-Patch/ Lidocaine 4-0.374-01-16 % Ptch Generic drug: Lido-Capsaicin-Men-Methyl Sal Apply 1 patch topically daily. What changed: additional instructions   acetaminophen 325 MG tablet Commonly known as: TYLENOL Take 2 tablets (650 mg total) by mouth every 6 (six) hours as needed for mild pain or moderate pain (or Fever).   amoxicillin 500 MG capsule Commonly known as: AMOXIL Take 1 capsule (500 mg total) by mouth every 12 (twelve) hours for 4 days.   buPROPion 150 MG 24 hr tablet Commonly known as: WELLBUTRIN XL TAKE 1 TABLET DAILY   divalproex 125 MG DR tablet Commonly known as: DEPAKOTE Take 125 mg by mouth 2 (two) times daily. 1 in am 2 at bedtime   escitalopram 20 MG tablet Commonly known as: LEXAPRO TAKE 1 TABLET DAILY   furosemide 40 MG tablet Commonly known as: LASIX TAKE 1 TABLET BY MOUTH EVERY DAY   irbesartan 300 MG tablet Commonly known as: AVAPRO Take 1 tablet (300 mg total) by mouth daily.   meclizine 25 MG tablet Commonly known as: ANTIVERT TAKE 1 TABLET 3 TIMES DAILYAS NEEDED FOR DIZZINESS OR NAUSEA What changed: See the new instructions.   Memantine HCl-Donepezil HCl 28-10 MG Cp24 Take 1 capsule by mouth daily.   metFORMIN 500 MG 24 hr tablet Commonly known as: GLUCOPHAGE-XR TAKE 1 TABLET BY MOUTH EVERY DAY WITH BREAKFAST What changed: See the new instructions.   naproxen sodium 220 MG tablet Commonly known as: ALEVE Take 220 mg by mouth.   omeprazole 20 MG capsule Commonly known as: PRILOSEC Take 1 capsule (20 mg total) by mouth daily.   risperiDONE 0.25 MG tablet Commonly known as: RISPERDAL Take 1 tab at night for two weeks then increase to 2 tabs at night and continue that dose   rosuvastatin 20 MG tablet Commonly known as: Crestor Take 1 tablet (20 mg total) by mouth daily.   vitamin B-12 1000 MCG tablet Commonly known as: CYANOCOBALAMIN Take 1,000 mcg by mouth daily.   Vitamin D3 1.25 MG (50000 UT) Tabs Take 50,000  Units by mouth once a week.        No Known Allergies     Procedures/Studies: DG Chest 1 View  Result Date: 06/30/2021 CLINICAL DATA:  Sepsis, hematuria, dementia, increased agitation EXAM: CHEST  1 VIEW COMPARISON:  Portable exam 1652 hours without priors for comparison FINDINGS: Normal heart size, mediastinal contours, and pulmonary vascularity. Atherosclerotic calcification aorta. Lungs clear. No pulmonary infiltrate, pleural effusion, or pneumothorax. Osseous structures unremarkable. Intraspinal stimulator and postsurgical changes of cervical fusion noted. IMPRESSION: No acute abnormalities. Aortic Atherosclerosis (ICD10-I70.0). Electronically Signed   By: Ulyses Southward M.D.   On: 06/30/2021 17:03   CT Renal Stone Study  Result Date: 06/30/2021 CLINICAL DATA:  Hematuria EXAM: CT ABDOMEN AND PELVIS WITHOUT CONTRAST TECHNIQUE: Multidetector CT imaging of the abdomen and pelvis was performed following the standard protocol without IV contrast. COMPARISON:  None. FINDINGS: Lower chest: Lung bases are clear. Hepatobiliary: Unenhanced liver is unremarkable. Gallbladder is unremarkable. No intrahepatic or extrahepatic ductal dilatation. Pancreas: Within normal limits. Spleen: Within normal limits. Adrenals/Urinary Tract: Adrenal glands are within normal limits. Punctate nonobstructing left lower pole renal calculus (series 3/image 46). Right kidney is within normal limits. No hydronephrosis. Bladder is mildly thick-walled although underdistended. Stomach/Bowel: Stomach is within normal limits. No evidence of bowel obstruction. Appendix is not discretely  visualized. Mild left colonic diverticulosis, without evidence of diverticulitis. Vascular/Lymphatic: No evidence of abdominal aortic aneurysm. Atherosclerotic calcifications of the abdominal aorta and branch vessels. Retroaortic left renal vein. No suspicious abdominopelvic lymphadenopathy. Reproductive: Prostate is unremarkable. Other: No  abdominopelvic ascites. Small fat containing periumbilical hernia (series 3/image 68). Musculoskeletal: Thoracic spine stimulator. Degenerative changes of the visualized thoracolumbar spine. 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 IMPRESSION: Punctate nonobstructing left lower pole renal calculus. No hydronephrosis. Thick-walled bladder, although underdistended. Electronically Signed   By: Julian Hy M.D.   On: 06/30/2021 03:13     Subjective: Patient with no nausea or vomiting, no chest pain or dyspnea, this is somnolent to my examination, but per his son at bedside he has been at his baseline, but had poor sleep last night.   Discharge Exam: Vitals:   07/03/21 2124 07/04/21 0613  BP: 127/63 133/72  Pulse: 72 (!) 58  Resp: 16 16  Temp: 99.5 F (37.5 C) 98.2 F (36.8 C)  SpO2: 94% 94%   Vitals:   07/03/21 1232 07/03/21 1834 07/03/21 2124 07/04/21 0613  BP: (!) 125/58 121/62 127/63 133/72  Pulse: 78 75 72 (!) 58  Resp:   16 16  Temp: 99.5 F (37.5 C) 97.9 F (36.6 C) 99.5 F (37.5 C) 98.2 F (36.8 C)  TempSrc:      SpO2: 96% 92% 94% 94%  Weight:        General: Not in pain or dyspnea.  Neurology: somnolent but easy to arouse.  E ENT: no pallor, no icterus, oral mucosa moist Cardiovascular: No JVD. S1-S2 present, rhythmic, no gallops, rubs, or murmurs. No lower extremity edema. Pulmonary: positive breath sounds bilaterally, adequate air movement, no wheezing, rhonchi or rales. Gastrointestinal. Abdomen soft and non tender, protuberant.  Skin. No rashes Musculoskeletal: no joint deformities   The results of significant diagnostics from this hospitalization (including imaging, microbiology, ancillary and laboratory) are listed below for reference.     Microbiology: Recent Results (from the past 240 hour(s))  Urine Culture     Status: Abnormal   Collection Time: 06/29/21 11:54 PM   Specimen: Urine, Clean Catch  Result Value Ref Range Status   Specimen Description URINE,  CLEAN CATCH  Final   Special Requests   Final    NONE Performed at [facility], [address] [place], [place] [address]    Culture 60,000 COLONIES/mL ESCHERICHIA COLI (A)  Final   Report Status 07/02/2021 FINAL  Final   Organism ID, Bacteria ESCHERICHIA COLI (A)  Final      Susceptibility   Escherichia coli - MIC*    AMPICILLIN <=2 SENSITIVE Sensitive     CEFAZOLIN <=4 SENSITIVE Sensitive     CEFEPIME <=0.12 SENSITIVE Sensitive     CEFTRIAXONE <=0.25 SENSITIVE Sensitive     CIPROFLOXACIN <=0.25 SENSITIVE Sensitive     GENTAMICIN <=1 SENSITIVE Sensitive     IMIPENEM <=0.25 SENSITIVE Sensitive     NITROFURANTOIN <=16 SENSITIVE Sensitive     TRIMETH/SULFA <=20 SENSITIVE Sensitive     AMPICILLIN/SULBACTAM <=2 SENSITIVE Sensitive     PIP/TAZO <=4 SENSITIVE Sensitive     * 60,000 COLONIES/mL ESCHERICHIA COLI  Blood culture (routine x 2)     Status: None (Preliminary result)   Collection Time: 06/30/21  3:15 PM   Specimen: BLOOD LEFT WRIST  Result Value Ref Range Status   Specimen Description BLOOD LEFT WRIST  Final   Special Requests   Final    BOTTLES DRAWN AEROBIC AND ANAEROBIC  Blood Culture results may not be optimal due to an inadequate volume of blood received in culture bottles   Culture   Final    NO GROWTH 3 DAYS Performed at [facility], [place] [address], [place], [place] [address]    Report Status PENDING  Incomplete  Resp Panel by RT-PCR (Flu A&B, Covid) Nasopharyngeal Swab     Status: None   Collection Time: 06/30/21  4:16 PM   Specimen: Nasopharyngeal Swab; Nasopharyngeal(NP) swabs in vial transport medium  Result Value Ref Range Status   SARS Coronavirus 2 by RT PCR NEGATIVE NEGATIVE Final    Comment: (NOTE) SARS-CoV-2 target nucleic acids are NOT DETECTED.  The SARS-CoV-2 RNA is generally detectable in upper respiratory specimens during the acute phase of infection. The lowest concentration of SARS-CoV-2 viral copies this assay can detect is 138  copies/mL. A negative result does not preclude SARS-Cov-2 infection and should not be used as the sole basis for treatment or other patient management decisions. A negative result may occur with  improper specimen collection/handling, submission of specimen other than nasopharyngeal swab, presence of viral mutation(s) within the areas targeted by this assay, and inadequate number of viral copies(<138 copies/mL). A negative result must be combined with clinical observations, patient history, and epidemiological information. The expected result is Negative.  Fact Sheet for Patients:  EntrepreneurPulse.com.au  Fact Sheet for Healthcare Providers:  IncredibleEmployment.be  This test is no t yet approved or cleared by the [place] FDA and  has been authorized for detection and/or diagnosis of SARS-CoV-2 by FDA under an Emergency Use Authorization (EUA). This EUA will remain  in effect (meaning this test can be used) for the duration of the COVID-19 declaration under Section 564(b)(1) of the Act, 21 U.S.C.section 360bbb-3(b)(1), unless the authorization is terminated  or revoked sooner.       Influenza A by PCR NEGATIVE NEGATIVE Final   Influenza B by PCR NEGATIVE NEGATIVE Final    Comment: (NOTE) The Xpert Xpress SARS-CoV-2/FLU/RSV plus assay is intended as an aid in the diagnosis of influenza from Nasopharyngeal swab specimens and should not be used as a sole basis for treatment. Nasal washings and aspirates are unacceptable for Xpert Xpress SARS-CoV-2/FLU/RSV testing.  Fact Sheet for Patients: EntrepreneurPulse.com.au  Fact Sheet for Healthcare Providers: IncredibleEmployment.be  This test is not yet approved or cleared by the [place] FDA and has been authorized for detection and/or diagnosis of SARS-CoV-2 by FDA under an Emergency Use Authorization (EUA). This EUA will remain in effect (meaning  this test can be used) for the duration of the COVID-19 declaration under Section 564(b)(1) of the Act, 21 U.S.C. section 360bbb-3(b)(1), unless the authorization is terminated or revoked.  Performed at [facility], [place] [address] [place], [place] [address]   Blood culture (routine x 2)     Status: None (Preliminary result)   Collection Time: 06/30/21  4:16 PM   Specimen: BLOOD  Result Value Ref Range Status   Specimen Description BLOOD BLOOD RIGHT FOREARM  Final   Special Requests   Final    BOTTLES DRAWN AEROBIC AND ANAEROBIC Blood Culture results may not be optimal due to an inadequate volume of blood received in culture bottles   Culture   Final    NO GROWTH 3 DAYS Performed at [facility], [place] [address], [place], [place] [address]    Report Status PENDING  Incomplete     Labs: BNP (last 3 results) No results for  input(s): BNP in the last 8760 hours. Basic Metabolic Panel: Recent Labs  Lab 06/30/21 0014 07/01/21 0342 07/02/21 0243  NA 133* 132* 135  K 3.9 3.9 3.8  CL 101 102 100  CO2 23 21* 27  GLUCOSE 141* 128* 158*  BUN 13 8 9   CREATININE 0.85 0.79 0.94  CALCIUM 9.7 9.1 9.1   Liver Function Tests: Recent Labs  Lab 06/30/21 0014  AST 16  ALT 16  ALKPHOS 43  BILITOT 0.6  PROT 7.1  ALBUMIN 3.7   No results for input(s): LIPASE, AMYLASE in the last 168 hours. No results for input(s): AMMONIA in the last 168 hours. CBC: Recent Labs  Lab 06/30/21 0014 07/01/21 0342 07/02/21 0243 07/03/21 0236  WBC 19.1* 20.5* 11.8* 5.4  NEUTROABS 15.5*  --  9.8* 3.7  HGB 15.2 13.8 13.8 13.2  HCT 44.8 41.9 40.8 38.2*  MCV 95.7 100.2* 96.7 94.1  PLT 218 154 167 185   Cardiac Enzymes: No results for input(s): CKTOTAL, CKMB, CKMBINDEX, TROPONINI in the last 168 hours. BNP: Invalid input(s): POCBNP CBG: Recent Labs  Lab 07/03/21 0809 07/03/21 1225 07/03/21 1552 07/03/21 2126 07/04/21 0748  GLUCAP 142* 207* 200* 192* 164*   D-Dimer No results  for input(s): DDIMER in the last 72 hours. Hgb A1c No results for input(s): HGBA1C in the last 72 hours. Lipid Profile No results for input(s): CHOL, HDL, LDLCALC, TRIG, CHOLHDL, LDLDIRECT in the last 72 hours. Thyroid function studies No results for input(s): TSH, T4TOTAL, T3FREE, THYROIDAB in the last 72 hours.  Invalid input(s): FREET3 Anemia work up No results for input(s): VITAMINB12, FOLATE, FERRITIN, TIBC, IRON, RETICCTPCT in the last 72 hours. Urinalysis    Component Value Date/Time   COLORURINE AMBER (A) 06/29/2021 2354   APPEARANCEUR CLOUDY (A) 06/29/2021 2354   LABSPEC 1.028 06/29/2021 2354   PHURINE 5.0 06/29/2021 2354   GLUCOSEU NEGATIVE 06/29/2021 2354   HGBUR LARGE (A) 06/29/2021 2354   BILIRUBINUR NEGATIVE 06/29/2021 2354   KETONESUR 5 (A) 06/29/2021 2354   PROTEINUR 100 (A) 06/29/2021 2354   NITRITE NEGATIVE 06/29/2021 2354   LEUKOCYTESUR MODERATE (A) 06/29/2021 2354   Sepsis Labs Invalid input(s): PROCALCITONIN,  WBC,  LACTICIDVEN Microbiology Recent Results (from the past 240 hour(s))  Urine Culture     Status: Abnormal   Collection Time: 06/29/21 11:54 PM   Specimen: Urine, Clean Catch  Result Value Ref Range Status   Specimen Description URINE, CLEAN CATCH  Final   Special Requests   Final    NONE Performed at [facility], [place] [address] [place], [place] [address]    Culture 60,000 COLONIES/mL ESCHERICHIA COLI (A)  Final   Report Status 07/02/2021 FINAL  Final   Organism ID, Bacteria ESCHERICHIA COLI (A)  Final      Susceptibility   Escherichia coli - MIC*    AMPICILLIN <=2 SENSITIVE Sensitive     CEFAZOLIN <=4 SENSITIVE Sensitive     CEFEPIME <=0.12 SENSITIVE Sensitive     CEFTRIAXONE <=0.25 SENSITIVE Sensitive     CIPROFLOXACIN <=0.25 SENSITIVE Sensitive     GENTAMICIN <=1 SENSITIVE Sensitive     IMIPENEM <=0.25 SENSITIVE Sensitive     NITROFURANTOIN <=16 SENSITIVE Sensitive     TRIMETH/SULFA <=20 SENSITIVE Sensitive      AMPICILLIN/SULBACTAM <=2 SENSITIVE Sensitive     PIP/TAZO <=4 SENSITIVE Sensitive     * 60,000 COLONIES/mL ESCHERICHIA COLI  Blood culture (routine x 2)     Status: None (Preliminary result)  Collection Time: 06/30/21  3:15 PM   Specimen: BLOOD LEFT WRIST  Result Value Ref Range Status   Specimen Description BLOOD LEFT WRIST  Final   Special Requests   Final    BOTTLES DRAWN AEROBIC AND ANAEROBIC Blood Culture results may not be optimal due to an inadequate volume of blood received in culture bottles   Culture   Final    NO GROWTH 3 DAYS Performed at [facility], [place] [address], [place], [place] [address]    Report Status PENDING  Incomplete  Resp Panel by RT-PCR (Flu A&B, Covid) Nasopharyngeal Swab     Status: None   Collection Time: 06/30/21  4:16 PM   Specimen: Nasopharyngeal Swab; Nasopharyngeal(NP) swabs in vial transport medium  Result Value Ref Range Status   SARS Coronavirus 2 by RT PCR NEGATIVE NEGATIVE Final    Comment: (NOTE) SARS-CoV-2 target nucleic acids are NOT DETECTED.  The SARS-CoV-2 RNA is generally detectable in upper respiratory specimens during the acute phase of infection. The lowest concentration of SARS-CoV-2 viral copies this assay can detect is 138 copies/mL. A negative result does not preclude SARS-Cov-2 infection and should not be used as the sole basis for treatment or other patient management decisions. A negative result may occur with  improper specimen collection/handling, submission of specimen other than nasopharyngeal swab, presence of viral mutation(s) within the areas targeted by this assay, and inadequate number of viral copies(<138 copies/mL). A negative result must be combined with clinical observations, patient history, and epidemiological information. The expected result is Negative.  Fact Sheet for Patients:  EntrepreneurPulse.com.au  Fact Sheet for Healthcare Providers:   IncredibleEmployment.be  This test is no t yet approved or cleared by the [place] FDA and  has been authorized for detection and/or diagnosis of SARS-CoV-2 by FDA under an Emergency Use Authorization (EUA). This EUA will remain  in effect (meaning this test can be used) for the duration of the COVID-19 declaration under Section 564(b)(1) of the Act, 21 U.S.C.section 360bbb-3(b)(1), unless the authorization is terminated  or revoked sooner.       Influenza A by PCR NEGATIVE NEGATIVE Final   Influenza B by PCR NEGATIVE NEGATIVE Final    Comment: (NOTE) The Xpert Xpress SARS-CoV-2/FLU/RSV plus assay is intended as an aid in the diagnosis of influenza from Nasopharyngeal swab specimens and should not be used as a sole basis for treatment. Nasal washings and aspirates are unacceptable for Xpert Xpress SARS-CoV-2/FLU/RSV testing.  Fact Sheet for Patients: EntrepreneurPulse.com.au  Fact Sheet for Healthcare Providers: IncredibleEmployment.be  This test is not yet approved or cleared by the [place] FDA and has been authorized for detection and/or diagnosis of SARS-CoV-2 by FDA under an Emergency Use Authorization (EUA). This EUA will remain in effect (meaning this test can be used) for the duration of the COVID-19 declaration under Section 564(b)(1) of the Act, 21 U.S.C. section 360bbb-3(b)(1), unless the authorization is terminated or revoked.  Performed at [facility], [place] [address] [place],  [address]   Blood culture (routine x 2)     Status: None (Preliminary result)   Collection Time: 06/30/21  4:16 PM   Specimen: BLOOD  Result Value Ref Range Status   Specimen Description BLOOD BLOOD RIGHT FOREARM  Final   Special Requests   Final    BOTTLES DRAWN AEROBIC AND ANAEROBIC Blood Culture results may not be optimal due to an inadequate volume of blood received in culture bottles   Culture  Final     NO GROWTH 3 DAYS Performed at [facility], [place] [address] [place], [place] [address]    Report Status PENDING  Incomplete     Time coordinating discharge: 45 minutes  SIGNED:   Tawni Millers, MD  [facility] 07/04/2021, 9:24 AM

## 2021-07-04 NOTE — TOC Transition Note (Signed)
Transition of Care [facility]) - CM/SW Discharge Note   Patient Details  Name: Cole Harris MRN: [id number] Date of Birth: 03-09-1946  [facility] CM/SW Contact:  Lawerance Sabal, RN Phone Number: 07/04/2021, 10:41 AM   Clinical Narrative:    Called room and spoke to patient's son. They would like [facility] services as ordered. No preference for [facility] agency. Referral made to [facility], no response. Referral made to [facility] No other TOC needs identified.     Final next level of care: Home w Home Health Services Barriers to Discharge: No Barriers Identified   Patient Goals and CMS Choice Patient states their goals for this hospitalization and ongoing recovery are:: to return home CMS Medicare.gov Compare Post Acute Care list provided to:: Other (Comment Required) Choice offered to / list presented to : Adult Children  Discharge Placement                       Discharge Plan and Services                DME Arranged: N/A         HH Arranged: PT HH Agency: [facility] Date HH Agency Contacted: 07/04/21 Time HH Agency Contacted: 1040 Representative spoke with at [facility] Agency: Stacie  Social Determinants of Health (SDOH) Interventions     Readmission Risk Interventions No flowsheet data found.

## 2021-07-05 LAB — CULTURE, BLOOD (ROUTINE X 2)
Culture: NO GROWTH
Culture: NO GROWTH

## 2021-07-06 ENCOUNTER — Telehealth: Payer: Self-pay

## 2021-07-06 NOTE — Telephone Encounter (Signed)
Transition Care Management Unsuccessful Follow-up Telephone Call  Date of discharge and from where:  [facility] on 07/04/21  Attempts:  1st Attempt  Reason for unsuccessful TCM follow-up call:  Left voice message

## 2021-07-07 ENCOUNTER — Telehealth: Payer: Self-pay

## 2021-07-07 NOTE — Telephone Encounter (Signed)
Transition Care Management Unsuccessful Follow-up Telephone Call  Date of discharge and from where:  07/04/21 from [facility]  Attempts:  2nd Attempt  Reason for unsuccessful TCM follow-up call:  Left voice message

## 2021-07-10 ENCOUNTER — Telehealth: Payer: Self-pay | Admitting: Family Medicine

## 2021-07-10 NOTE — Telephone Encounter (Signed)
Attempted to contact Cole Harris. No answer.  Vm not set up.  Need to find out what she wants to discuss with Dr. Reece Agar and what verbal orders is she requesting.

## 2021-07-10 NOTE — Telephone Encounter (Signed)
Amy from [facility] saw pt for physical therapy and want to discuss some things with provider and want to place a verbal order

## 2021-07-10 NOTE — Telephone Encounter (Signed)
Plz call PT for details.

## 2021-07-13 NOTE — Telephone Encounter (Signed)
Attempted to contact Cole Harris. No answer.  Vm not set up.  Need to find out what she wants to discuss with Dr. Reece Agar and what verbal orders is she requesting.

## 2021-07-13 NOTE — Telephone Encounter (Signed)
Tried to call Amy again and unable to reach or leave a message. Voicemail has not yet been set up.

## 2021-07-14 NOTE — Telephone Encounter (Addendum)
Agree with verbal as per below.  I haven't seen pt since 11/2020. Would have therapist try HHPT with patient and if unable to cooperate due to dementia, ok to cancel early.  Ok to continue daily vit D3 dose - does she know what dose (1000, 2000)?  We were unable to reach patient for hosp f/u phone call and to schedule hospital f/u visit - would offer OV prior to next appt 08/2021 to review recent hospitalization (he is overdue for this).

## 2021-07-14 NOTE — Telephone Encounter (Signed)
Spoke with Amy, PT with [facility], asking for clarification of message.  First of all, needs verbal orders for PT for 2x/wk for 2 wks, then 1x/wk for 4 wks.  Secondly, she is questioning if pt will want to participate in PT for his benefit due to dementia.  Lastly, wants to make Dr. Reece Agar aware when she was reconciling pt's meds, she noticed on d/c ppw vit D3 50,000 U once a week. When she asked about med, told pt is only taking vit D3 daily.

## 2021-07-15 NOTE — Telephone Encounter (Signed)
Plz schedule hospital f/u asap.  Needs this f/u before 09/04/21 MCR cpe.

## 2021-07-15 NOTE — Telephone Encounter (Signed)
Noted  

## 2021-07-15 NOTE — Addendum Note (Signed)
Addended by: Nanci Pina on: 07/15/2021 11:12 AM   Modules accepted: Orders

## 2021-07-15 NOTE — Telephone Encounter (Signed)
Pt scheduled a F/U apt on 12.19.22

## 2021-07-15 NOTE — Telephone Encounter (Signed)
Spoke with Amy relaying Dr. Timoteo Expose message.  Verbalizes understanding.  States pt is taking vit D 1000 U daily.   Discontinued vit D3 50,000 wkly.

## 2021-07-21 DIAGNOSIS — G4733 Obstructive sleep apnea (adult) (pediatric): Secondary | ICD-10-CM

## 2021-07-21 DIAGNOSIS — I10 Essential (primary) hypertension: Secondary | ICD-10-CM

## 2021-07-21 DIAGNOSIS — K219 Gastro-esophageal reflux disease without esophagitis: Secondary | ICD-10-CM

## 2021-07-21 DIAGNOSIS — I272 Pulmonary hypertension, unspecified: Secondary | ICD-10-CM

## 2021-07-21 DIAGNOSIS — B9620 Unspecified Escherichia coli [E. coli] as the cause of diseases classified elsewhere: Secondary | ICD-10-CM

## 2021-07-21 DIAGNOSIS — G300 Alzheimer's disease with early onset: Secondary | ICD-10-CM

## 2021-07-21 DIAGNOSIS — E1165 Type 2 diabetes mellitus with hyperglycemia: Secondary | ICD-10-CM

## 2021-07-21 DIAGNOSIS — E785 Hyperlipidemia, unspecified: Secondary | ICD-10-CM

## 2021-07-21 DIAGNOSIS — N390 Urinary tract infection, site not specified: Secondary | ICD-10-CM | POA: Diagnosis not present

## 2021-07-21 DIAGNOSIS — R6520 Severe sepsis without septic shock: Secondary | ICD-10-CM | POA: Diagnosis not present

## 2021-07-21 DIAGNOSIS — G9341 Metabolic encephalopathy: Secondary | ICD-10-CM

## 2021-07-21 DIAGNOSIS — F0280 Dementia in other diseases classified elsewhere without behavioral disturbance: Secondary | ICD-10-CM

## 2021-07-21 DIAGNOSIS — N200 Calculus of kidney: Secondary | ICD-10-CM

## 2021-07-21 DIAGNOSIS — A419 Sepsis, unspecified organism: Secondary | ICD-10-CM

## 2021-07-21 DIAGNOSIS — E1169 Type 2 diabetes mellitus with other specified complication: Secondary | ICD-10-CM

## 2021-07-21 DIAGNOSIS — I2510 Atherosclerotic heart disease of native coronary artery without angina pectoris: Secondary | ICD-10-CM

## 2021-07-22 ENCOUNTER — Telehealth: Payer: Self-pay | Admitting: Family Medicine

## 2021-07-22 NOTE — Telephone Encounter (Signed)
Amy Physical therapist from [facility] called in stated she needs to reschedule pt visit to next week . Can be reach # [phone]

## 2021-07-25 ENCOUNTER — Other Ambulatory Visit: Payer: Self-pay | Admitting: Family Medicine

## 2021-07-27 NOTE — Telephone Encounter (Signed)
Agree with this verbal order. Thank you.  °

## 2021-07-27 NOTE — Telephone Encounter (Signed)
Spoke Cole Harris to see if she's requesting verbal orders to r/s pt's visit.  Cole Harris confirms she is asking for verbal orders.

## 2021-07-28 NOTE — Telephone Encounter (Signed)
Spoke with Cole Harris informing her Dr. Reece Agar is giving verbal orders to r/s PT visit(s).

## 2021-07-28 NOTE — Telephone Encounter (Signed)
Agree with this verbals order.

## 2021-07-28 NOTE — Telephone Encounter (Signed)
Amy from [facility] called in requesting verbal orders for Pt to see a Child psychotherapist  # [phone]

## 2021-07-29 NOTE — Telephone Encounter (Signed)
Spoke with Amy informing her Dr. G is giving verbal orders for services requested.  

## 2021-07-29 NOTE — Telephone Encounter (Signed)
Left a message on voicemail for patient to call the office back. Need to discuss medications and when they were last refilled and what pharmacy he is using.

## 2021-08-04 ENCOUNTER — Other Ambulatory Visit: Payer: No Typology Code available for payment source

## 2021-08-06 ENCOUNTER — Other Ambulatory Visit: Payer: Self-pay | Admitting: Family Medicine

## 2021-08-06 DIAGNOSIS — E1165 Type 2 diabetes mellitus with hyperglycemia: Secondary | ICD-10-CM

## 2021-08-17 ENCOUNTER — Ambulatory Visit: Payer: No Typology Code available for payment source | Admitting: Family Medicine

## 2021-08-28 ENCOUNTER — Other Ambulatory Visit: Payer: No Typology Code available for payment source

## 2021-09-04 ENCOUNTER — Encounter: Payer: No Typology Code available for payment source | Admitting: Family Medicine

## 2021-09-22 ENCOUNTER — Other Ambulatory Visit: Payer: Self-pay | Admitting: Family Medicine

## 2021-09-23 NOTE — Telephone Encounter (Signed)
E-scribed 3 meds.    Denied metformin request.  Rx changed on 08/06/21 to metformin XR 500 mg and new rx sent to [facility].

## 2021-10-22 ENCOUNTER — Other Ambulatory Visit: Payer: Self-pay | Admitting: Family Medicine

## 2021-11-27 ENCOUNTER — Other Ambulatory Visit: Payer: Self-pay

## 2021-11-27 ENCOUNTER — Encounter: Payer: No Typology Code available for payment source | Admitting: Family Medicine

## 2021-11-27 DIAGNOSIS — E1165 Type 2 diabetes mellitus with hyperglycemia: Secondary | ICD-10-CM

## 2021-11-27 MED ORDER — METFORMIN HCL ER 500 MG PO TB24: ORAL_TABLET | ORAL | 0 refills | Status: DC

## 2021-11-27 NOTE — Telephone Encounter (Signed)
Patient son called requesting refill on metformin needs  ?[facility] and script sent to home free to get started on pill packs with them  ?

## 2021-11-27 NOTE — Telephone Encounter (Signed)
E-scribed 30-day rx to [facility].  Pt has CPE on 12/02/21. Will send 90-day refill to [facility] after visit. ?

## 2021-11-30 ENCOUNTER — Telehealth: Payer: Self-pay | Admitting: Family Medicine

## 2021-11-30 NOTE — Telephone Encounter (Signed)
LVM for pt to rtn my call to schedule awv with nha. Please schedule this appt if pt calls the office.  

## 2021-12-02 ENCOUNTER — Encounter: Payer: Self-pay | Admitting: Family Medicine

## 2021-12-02 ENCOUNTER — Ambulatory Visit: Payer: Medicare Other

## 2021-12-02 ENCOUNTER — Ambulatory Visit (INDEPENDENT_AMBULATORY_CARE_PROVIDER_SITE_OTHER): Payer: No Typology Code available for payment source | Admitting: Family Medicine

## 2021-12-02 VITALS — BP 122/74 | HR 71 | Temp 97.8°F | Ht 67.5 in | Wt 249.5 lb

## 2021-12-02 DIAGNOSIS — E785 Hyperlipidemia, unspecified: Secondary | ICD-10-CM

## 2021-12-02 DIAGNOSIS — N390 Urinary tract infection, site not specified: Secondary | ICD-10-CM

## 2021-12-02 DIAGNOSIS — Z0000 Encounter for general adult medical examination without abnormal findings: Secondary | ICD-10-CM | POA: Diagnosis not present

## 2021-12-02 DIAGNOSIS — K219 Gastro-esophageal reflux disease without esophagitis: Secondary | ICD-10-CM

## 2021-12-02 DIAGNOSIS — R159 Full incontinence of feces: Secondary | ICD-10-CM

## 2021-12-02 DIAGNOSIS — G300 Alzheimer's disease with early onset: Secondary | ICD-10-CM | POA: Diagnosis not present

## 2021-12-02 DIAGNOSIS — E538 Deficiency of other specified B group vitamins: Secondary | ICD-10-CM

## 2021-12-02 DIAGNOSIS — G4733 Obstructive sleep apnea (adult) (pediatric): Secondary | ICD-10-CM

## 2021-12-02 DIAGNOSIS — E1165 Type 2 diabetes mellitus with hyperglycemia: Secondary | ICD-10-CM | POA: Diagnosis not present

## 2021-12-02 DIAGNOSIS — F0280 Dementia in other diseases classified elsewhere without behavioral disturbance: Secondary | ICD-10-CM

## 2021-12-02 DIAGNOSIS — E1169 Type 2 diabetes mellitus with other specified complication: Secondary | ICD-10-CM

## 2021-12-02 DIAGNOSIS — Z7189 Other specified counseling: Secondary | ICD-10-CM | POA: Diagnosis not present

## 2021-12-02 DIAGNOSIS — I272 Pulmonary hypertension, unspecified: Secondary | ICD-10-CM

## 2021-12-02 DIAGNOSIS — Z794 Long term (current) use of insulin: Secondary | ICD-10-CM

## 2021-12-02 DIAGNOSIS — R32 Unspecified urinary incontinence: Secondary | ICD-10-CM

## 2021-12-02 DIAGNOSIS — E039 Hypothyroidism, unspecified: Secondary | ICD-10-CM

## 2021-12-02 MED ORDER — METFORMIN HCL ER 500 MG PO TB24: ORAL_TABLET | ORAL | 3 refills | Status: DC

## 2021-12-02 MED ORDER — NAPROXEN SODIUM 220 MG PO TABS: 220.0000 mg | ORAL_TABLET | Freq: Every day | ORAL | Status: AC | PRN

## 2021-12-02 MED ORDER — OMEPRAZOLE 20 MG PO CPDR: 20.0000 mg | DELAYED_RELEASE_CAPSULE | Freq: Every day | ORAL | 3 refills | Status: AC

## 2021-12-02 NOTE — Progress Notes (Signed)
? ? Patient ID: Cole Harris, male    DOB: July 24, 1946, 76 y.o.   MRN: [id number] ? ?This visit was conducted in person. ? ?BP 122/74   Pulse 71   Temp 97.8 ?F (36.6 ?C) (Temporal)   Ht 5' 7.5" (1.715 m)   Wt 249 lb 8 oz (113.2 kg)   SpO2 96%   BMI 38.50 kg/m?   ? ?CC: AMW ?Subjective:  ? ?HPI: ?Cole Harris is a 76 y.o. male presenting on 12/02/2021 for Medicare Wellness (Pt accompanied by son, Cole Harris. ) ? ? ?I first and last saw patient 11/2020.  ?Did not see health advisor this year.  ? ?Hearing Screening - Comments:: Could not complete hearing screening due to pt has  Alzheimer's dz. ?Vision Screening - Comments:: Per son, Cole Harris, pt has not had eye exam in past yr.  However, due pt's Alzheimer's dz, could not complete vision screening today.   ?[place] Office Visit from 10/03/2019 in [place] at [place]  ?PHQ-2 Total Score 0  ? ?  ?  ? ?  12/02/2021  ? 10:55 AM 10/03/2019  ?  3:19 PM 05/23/2019  ?  3:20 PM 09/22/2018  ? 12:39 PM 08/12/2017  ?  3:00 PM  ?Fall Risk   ?Falls in the past year? 1 1 1 1  No  ?Comment Tripped      ?Number falls in past yr: 0 1 0 0   ?Injury with Fall? 0 0 1 1   ?Comment  just a scratch on his head, did not get seen     ?Risk for fall due to :   History of fall(s) Impaired balance/gait;Impaired mobility   ?Follow up   Falls evaluation completed    ? 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 Needs 24/7 supervision. He last saw neurology [facility]ama) last month. On risperdal 1mg  nightly, depakote 125mg  am and noon and 250mg  night time, namzaric 28-10mg  once daily, and lexapro 20mg  daily.  ? ?Recent hospitalization for E coli UTI with urosepsis, did not come in for hosp f/u visit. Hospital records reviewed. Treated metabolic encephalopathy with haldol , UTI with ceftriaxone then amoxicillin.  ? ?Lives with wife, son and DIL live next door- largely staying with him due to safety concerns. Son recently had back surgery.  ? ?Fully incontinent of bladder - using adult  diapers 2-3/day, uses 1-2 pads on bed. Partially incontinent of bowel.  ? ?Receiving [place] through [facility] (ordered by neurology).  ?Request PCS forms for medicare assistance.  ? ?Geriatric Assessment: ?Activities of Daily Living:  ?   Bathing- dependent ?   Dressing- dependent ?   Eating- dependent ?   Toileting- dependent ?   Transferring- independent ?   Continence- dependent ?Overall Assessment: dependent ? ?Instrumental Activities of Daily Living:  ?   Transportation- dependent ?   Meal/Food Preparation-dependent ?   Shopping Errands-dependent ?   Housekeeping/Chores-dependent ?   Money Management/Finances-dependent ?   Medication Management-dependent ?   Ability to Use Telephone-dependent ?   Laundry-dependent  ?Overall Assessment: dependent ? ?Preventative: ?Colon cancer screening - h/o colonoscopy unsure dates - deferred due to dementia  ?Prostate cancer screening - defer due to dementia ?Lung cancer screening - not eligible  ?Flu shot - yearly ?COVID shot - Prizer 10/2019, 10/2019, booster 06/2020 ?Tetanus shot - unsure ?Prevnar-13 2016, pneumovax 2020 ?Shingrix - to consider  ?Advanced directive discussion - does not have - working on this. Wife then son Cole Harris are [place].  ?Sunscreen use discussed. No  changing moles on skin. ?Ex smoker ~1993 ?Alcohol  - none ?Dentist - full dentures  ?Eye exam - has not seen ?Bowel - recent constipation - started OTC stool softeners, increased fiber recently, noticing more bowel accidents recently thought due to progressive dementia ?Bladder - full incontinence regularly wears adult diapers ? ?Lives with wife Cole Harris. Son and DIL are next door neighbors.  ?   ? ?Relevant past medical, surgical, family and social history reviewed and updated as indicated. Interim medical history since our last visit reviewed. ?Allergies and medications reviewed and updated. ?Outpatient Medications Prior to Visit  ?Medication Sig Dispense Refill  ? acetaminophen (TYLENOL) 325 MG tablet Take 2  tablets (650 mg total) by mouth every 6 (six) hours as needed for mild pain or moderate pain (or Fever).    ? buPROPion (WELLBUTRIN XL) 150 MG 24 hr tablet TAKE ONE TABLET BY MOUTH DAILY 90 tablet 0  ? divalproex (DEPAKOTE) 125 MG DR tablet Take 125 mg by mouth 2 (two) times daily. 1 in AM, 1 at noon, 2 at bedtime    ? escitalopram (LEXAPRO) 20 MG tablet TAKE ONE TABLET BY MOUTH DAILY 90 tablet 0  ? Memantine HCl-Donepezil HCl 28-10 MG CP24 Take 1 capsule by mouth daily.    ? risperiDONE (RISPERDAL) 1 MG tablet Take 1 mg by mouth at bedtime.    ? rosuvastatin (CRESTOR) 20 MG tablet TAKE ONE TABLET BY MOUTH AT BEDTIME 90 tablet 0  ? vitamin B-12 (CYANOCOBALAMIN) 1000 MCG tablet Take 1,000 mcg by mouth daily.    ? furosemide (LASIX) 40 MG tablet TAKE 1/2 TABLET BY MOUTH DAILY 45 tablet 0  ? irbesartan (AVAPRO) 300 MG tablet Take 1 tablet (300 mg total) by mouth daily. 90 tablet 0  ? metFORMIN (GLUCOPHAGE-XR) 500 MG 24 hr tablet TAKE 1 TABLET BY MOUTH EVERY DAY WITH BREAKFAST 30 tablet 0  ? naproxen sodium (ALEVE) 220 MG tablet Take 220 mg by mouth.    ? omeprazole (PRILOSEC) 20 MG capsule NEW PRESCRIPTION REQUEST: TAKE ONE CAPSULE BY MOUTH DAILY 90 capsule 3  ? furosemide (LASIX) 40 MG tablet Take 0.5 tablets (20 mg total) by mouth daily.    ? Lido-Capsaicin-Men-Methyl Sal (1ST MEDX-PATCH/ LIDOCAINE) 4-0.374-01-16 % PTCH Apply 1 patch topically daily. (Patient taking differently: Apply 1 patch topically daily. As needed.) 30 patch 5  ? meclizine (ANTIVERT) 25 MG tablet TAKE 1 TABLET 3 TIMES DAILYAS NEEDED FOR DIZZINESS OR NAUSEA (Patient taking differently: Take 25 mg by mouth 3 (three) times daily as needed for dizziness or nausea.) 90 tablet 0  ? risperiDONE (RISPERDAL) 0.25 MG tablet Take 1 tab at night for two weeks then increase to 2 tabs at night and continue that dose    ? ?No facility-administered medications prior to visit.  ?  ? ?Per HPI unless specifically indicated in ROS section below ?Review of  Systems ? ?Objective:  ?BP 122/74   Pulse 71   Temp 97.8 ?F (36.6 ?C) (Temporal)   Ht 5' 7.5" (1.715 m)   Wt 249 lb 8 oz (113.2 kg)   SpO2 96%   BMI 38.50 kg/m?   ?Wt Readings from Last 3 Encounters:  ?12/02/21 249 lb 8 oz (113.2 kg)  ?07/03/21 267 lb 13.7 oz (121.5 kg)  ?06/22/21 260 lb (117.9 kg)  ?  ?  ?Physical Exam ?Vitals and nursing note reviewed.  ?Constitutional:   ?   Appearance: Normal appearance. He is not ill-appearing.  ?Cardiovascular:  ?   Rate and  Rhythm: Normal rate and regular rhythm.  ?   Pulses: Normal pulses.  ?   Heart sounds: Normal heart sounds. No murmur heard. ?Pulmonary:  ?   Effort: Pulmonary effort is normal. No respiratory distress.  ?   Breath sounds: Normal breath sounds. No wheezing, rhonchi or rales.  ?Musculoskeletal:  ?   Right lower leg: No edema.  ?   Left lower leg: No edema.  ?Skin: ?   General: Skin is warm and dry.  ?   Findings: No rash.  ?Neurological:  ?   Mental Status: He is alert.  ?Psychiatric:     ?   Mood and Affect: Mood normal.     ?   Behavior: Behavior normal.  ? ?   ? ?Lab Results  ?Component Value Date  ? CHOL 119 12/03/2021  ? HDL 34.50 (L) 12/03/2021  ? [place] 60 12/03/2021  ? TRIG 121.0 12/03/2021  ? CHOLHDL 3 12/03/2021  ?  ?Lab Results  ?Component Value Date  ? CREATININE 0.83 12/03/2021  ? BUN 14 12/03/2021  ? NA 139 12/03/2021  ? K 4.0 12/03/2021  ? CL 103 12/03/2021  ? CO2 28 12/03/2021  ?  ?Lab Results  ?Component Value Date  ? TSH 1.49 12/05/2020  ?   ?Lab Results  ?Component Value Date  ? [id number] 1,011 12/05/2020  ?  ?Assessment & Plan:  ? ?Problem List Items Addressed This Visit   ? ? Medicare annual wellness visit, subsequent - Primary (Chronic)  ?  I have personally reviewed the Medicare Annual Wellness questionnaire and have noted ?1. The patient's medical and social history ?2. Their use of alcohol, tobacco or illicit drugs ?3. Their current medications and supplements ?4. The patient's functional ability including ADL's, fall risks,  home safety risks and hearing or visual impairment. Cognitive function has been assessed and addressed as indicated.  ?5. Diet and physical activity ?6. Evidence for depression or mood disorders ?The patients weight, height, BMI

## 2021-12-02 NOTE — Patient Instructions (Addendum)
Return tomorrow for fasting labs.  ?Double check on med list at home to ensure accurate (is he still taking wellbutrin, omeprazole?).  ?If interested, check with pharmacy about new 2 shot shingles series (shingrix).  ?I will ask care guides to contact you for assistance at home.  ?Good to see you today.  ?Return in 6 months for follow up visit.  ? ?Dementia Caregiver Guide ?Dementia is a term used to describe a number of symptoms that affect memory and thinking. The most common symptoms include: ?Memory loss. ?Trouble with language and communication. ?Trouble concentrating. ?Poor judgment and problems with reasoning. ?Wandering from home or public places. ?Extreme anxiety or depression. ?Being suspicious or having angry outbursts and accusations. ?Child-like behavior and language. ?Dementia can be frightening and confusing. And taking care of someone with dementia can be challenging. This guide provides tips to help you when providing care for a person with dementia. ?How to help manage lifestyle changes ?Dementia usually gets worse slowly over time. In the early stages, people with dementia can stay independent and safe with some help. In later stages, they need help with daily tasks such as dressing, grooming, and using the bathroom. There are actions you can take to help a person manage his or her life while living with this condition. ?Communicating ?When the person is talking or seems frustrated, make eye contact and hold the person's hand. ?Ask specific questions that need yes or no answers. ?Use simple words, short sentences, and a calm voice. Only give one direction at a time. ?When offering choices, limit the person to just one or two. ?Avoid correcting the person in a negative way. ?If the person is struggling to find the right words, gently try to help him or her. ?Preventing injury ? ?Keep floors clear of clutter. Remove rugs, magazine racks, and floor lamps. ?Keep hallways well lit, especially at  night. ?Put a handrail and nonslip mat in the bathtub or shower. ?Put childproof locks on cabinets that contain dangerous items, such as medicines, alcohol, guns, toxic cleaning items, sharp tools or utensils, matches, and lighters. ?For doors to the outside of the house, put the locks in places where the person cannot see or reach them easily. This will help ensure that the person does not wander out of the house and get lost. ?Be prepared for emergencies. Keep a list of emergency phone numbers and addresses in a convenient area. ?Remove car keys and lock garage doors so that the person does not try to get in the car and drive. ?Have the person wear a bracelet that tracks locations and identifies the person as having memory problems. This should be worn at all times for safety. ?Helping with daily life ? ?Keep the person on track with his or her routine. ?Try to identify areas where the person may need help. ?Be supportive, patient, calm, and encouraging. ?Gently remind the person that adjusting to changes takes time. ?Help with the tasks that the person has asked for help with. ?Keep the person involved in daily tasks and decisions as much as possible. ?Encourage conversation, but try not to get frustrated if the person struggles to find words or does not seem to appreciate your help. ?How to recognize stress ?Look for signs of stress in yourself and in the person you are caring for. If you notice signs of stress, take steps to manage it. Symptoms of stress include: ?Feeling anxious, irritable, frustrated, or angry. ?Denying that the person has dementia or that his or  her symptoms will not improve. ?Feeling depressed, hopeless, or unappreciated. ?Difficulty sleeping. ?Difficulty concentrating. ?Developing stress-related health problems. ?Feeling like you have too little time for your own life. ?Follow these instructions at home: ?Take care of your health ?Make sure that you and the person you are caring for: ?Get  regular sleep. ?Exercise regularly. ?Eat regular, nutritious meals. ?Take over-the-counter and prescription medicines only as told by your health care providers. ?Drink enough fluid to keep your urine pale yellow. ?Attend all scheduled health care appointments. ? ?General instructions ?Join a support group with others who are caregivers. ?Ask about respite care resources. Respite care can provide short-term care for the person so that you can have a regular break from the stress of caregiving. ?Consider any safety risks and take steps to avoid them. ?Organize medicines in a pill box for each day of the week. ?Create a plan to handle any legal or financial matters. Get legal or financial advice if needed. ?Keep a calendar in a central location to remind the person of appointments or other activities. ?Where to find support: ?Many individuals and organizations offer support. These include: ?Support groups for people with dementia. ?Support groups for caregivers. ?Counselors or therapists. ?Home health care services. ?Adult day care centers. ?Where to find more information ?Centers for Disease Control and Prevention: FootballExhibition.com.br ?Alzheimer's Association: LimitLaws.hu ?Family Caregiver Alliance: Pension scheme manager.org ?Alzheimer's Foundation of [place]: www.alzfdn.org ?Contact a health care provider if: ?The person's health is rapidly getting worse. ?You are no longer able to care for the person. ?Caring for the person is affecting your physical and emotional health. ?You are feeling depressed or anxious about caring for the person. ?Get help right away if: ?The person threatens himself or herself, you, or anyone else. ?You feel depressed or sad, or feel that you want to harm yourself. ?If you ever feel like your loved one may hurt himself or herself or others, or if he or she shares thoughts about taking his or her own life, get help right away. You can go to your nearest emergency department or: ?Call your local emergency  services (911 in the [place] ?Call a suicide crisis helpline, such as the National Suicide Prevention Lifeline at 936-428-4360 or 988 in the [place] This is open 24 hours a day in the [place] ?Text the Crisis Text Line at (207)482-3512 (in the [place] ?Summary ?Dementia is a term used to describe a number of symptoms that affect memory and thinking. ?Dementia usually gets worse slowly over time. ?Take steps to reduce the person's risk of injury and to plan for future care. ?Caregivers need support, relief from caregiving, and time for their own lives. ?This information is not intended to replace advice given to you by your health care provider. Make sure you discuss any questions you have with your health care provider. ?Document Revised: 03/11/2021 Document Reviewed: 12/31/2019 ?Elsevier Patient Education ? 2022 Elsevier Inc. ? ?

## 2021-12-03 ENCOUNTER — Other Ambulatory Visit (INDEPENDENT_AMBULATORY_CARE_PROVIDER_SITE_OTHER): Payer: No Typology Code available for payment source

## 2021-12-03 DIAGNOSIS — E1169 Type 2 diabetes mellitus with other specified complication: Secondary | ICD-10-CM

## 2021-12-03 DIAGNOSIS — E118 Type 2 diabetes mellitus with unspecified complications: Secondary | ICD-10-CM

## 2021-12-03 DIAGNOSIS — E785 Hyperlipidemia, unspecified: Secondary | ICD-10-CM

## 2021-12-03 LAB — COMPREHENSIVE METABOLIC PANEL
ALT: 9 U/L (ref 0–53)
AST: 14 U/L (ref 0–37)
Albumin: 3.9 g/dL (ref 3.5–5.2)
Alkaline Phosphatase: 40 U/L (ref 39–117)
BUN: 14 mg/dL (ref 6–23)
CO2: 28 mEq/L (ref 19–32)
Calcium: 9.8 mg/dL (ref 8.4–10.5)
Chloride: 103 mEq/L (ref 96–112)
Creatinine, Ser: 0.83 mg/dL (ref 0.40–1.50)
GFR: 86.22 mL/min (ref >60.00)
Glucose, Bld: 165 mg/dL — ABNORMAL HIGH (ref 70–99)
Potassium: 4 mEq/L (ref 3.5–5.1)
Sodium: 139 mEq/L (ref 135–145)
Total Bilirubin: 0.5 mg/dL (ref 0.2–1.2)
Total Protein: 6.7 g/dL (ref 6.0–8.3)

## 2021-12-03 LAB — LIPID PANEL
Cholesterol: 119 mg/dL (ref 0–200)
HDL: 34.5 mg/dL — ABNORMAL LOW (ref >39.00)
LDL Cholesterol: 60 mg/dL (ref 0–99)
NonHDL: 84
Total CHOL/HDL Ratio: 3
Triglycerides: 121 mg/dL (ref 0.0–149.0)
VLDL: 24.2 mg/dL (ref 0.0–40.0)

## 2021-12-03 LAB — HEMOGLOBIN A1C: Hgb A1c MFr Bld: 8.1 % — ABNORMAL HIGH (ref 4.6–6.5)

## 2021-12-05 ENCOUNTER — Encounter: Payer: Self-pay | Admitting: Family Medicine

## 2021-12-05 DIAGNOSIS — R159 Full incontinence of feces: Secondary | ICD-10-CM | POA: Insufficient documentation

## 2021-12-05 DIAGNOSIS — R32 Unspecified urinary incontinence: Secondary | ICD-10-CM | POA: Insufficient documentation

## 2021-12-05 DIAGNOSIS — Z0000 Encounter for general adult medical examination without abnormal findings: Secondary | ICD-10-CM | POA: Insufficient documentation

## 2021-12-05 DIAGNOSIS — Z7189 Other specified counseling: Secondary | ICD-10-CM | POA: Insufficient documentation

## 2021-12-05 NOTE — Assessment & Plan Note (Signed)
Off thyroid replacement, will need TSH checked next visit.  ?

## 2021-12-05 NOTE — Assessment & Plan Note (Signed)

## 2021-12-05 NOTE — Assessment & Plan Note (Signed)
-  Continue omeprazole 20 mg daily

## 2021-12-05 NOTE — Assessment & Plan Note (Signed)
He continues rosuvastatin 20mg  daily, now off niacin and gemfibrozil. Update FLP after this change.  ?The ASCVD Risk score (Arnett DK, et al., 2019) failed to calculate for the following reasons: ?  The valid total cholesterol range is 130 to 320 mg/dL  ?

## 2021-12-05 NOTE — Assessment & Plan Note (Addendum)
Update A1c on metformin XR 500mg  once daily.  ?Goal A1c <8% given age and comorbidities.  ?

## 2021-12-05 NOTE — Assessment & Plan Note (Signed)
Dementia - related ?

## 2021-12-05 NOTE — Assessment & Plan Note (Addendum)
His alzheimer's seems to have significantly progressed over the past year. Urinary and even some bowel incontinence is present. Dependent in ADLs/IADLs. Family is experiencing caregiver stress. They request more assistance at home. I will fill out [id number] form for PCS at home, and also refer to palliative care for further assistance/resources.  ?He is followed by [facility] neurology Dr Malvin Johns, on memantine/donepezil 28/10mg  daily. Also on risperdal and valproate 125mg  bid.  ?

## 2021-12-05 NOTE — Assessment & Plan Note (Addendum)
Continues vit b12 daily, will need levels updated.  ?

## 2021-12-05 NOTE — Assessment & Plan Note (Signed)
Advanced directive discussion - does not have - working on this. Wife then son [name] are HCP[employer].  ?

## 2021-12-05 NOTE — Assessment & Plan Note (Addendum)
Alzheimer's dementia related.  ?I asked him to find durable medical equipment supply company to send me order for incontinence supplies and I will fill this out  ?

## 2021-12-05 NOTE — Assessment & Plan Note (Signed)
With pulm HTN. Unsure if using BiPAP ?

## 2021-12-05 NOTE — Assessment & Plan Note (Addendum)
Recent hospitalization for UTI with sepsis. This seems resolved.  ?

## 2021-12-07 ENCOUNTER — Telehealth: Payer: Self-pay | Admitting: *Deleted

## 2021-12-07 ENCOUNTER — Telehealth: Payer: Self-pay

## 2021-12-07 NOTE — Chronic Care Management (AMB) (Signed)
?  Care Management  ? ?Note ? ?12/07/2021 ?Name: Cole Harris MRN: [id number] DOB: [birth year] ? ?Cole Harris is a 76 y.o. year old male who is a primary care patient of Eustaquio Boyden, MD. I reached out to [name] by phone today offer care coordination services.  ? ?[name] was given information about care management services today including:  ?Care management services include personalized support from designated clinical staff supervised by his physician, including individualized plan of care and coordination with other care providers ?24/7 contact phone numbers for assistance for urgent and routine care needs. ?The patient may stop care management services at any time by phone call to the office staff. ? ?Patient agreed to services and verbal consent obtained.  ? ?Follow up plan: ?Telephone appointment with care management team member scheduled for: 12/21/2021 ? ?Tayquan Gassman, CCMA ?Care Guide, Embedded Care Coordination ?[place]  Care Management  ?Direct Dial: [phone] ? ? ?

## 2021-12-07 NOTE — Telephone Encounter (Signed)
Faxed completed Independent Assessment for Personal Care Services form to [facility] at [phone].  Pt's son, [name] (on dpr), is aware.  ?

## 2021-12-09 ENCOUNTER — Ambulatory Visit: Payer: Medicare Other

## 2021-12-10 ENCOUNTER — Telehealth: Payer: Self-pay

## 2021-12-10 ENCOUNTER — Telehealth: Payer: Medicare Other | Admitting: Hospice

## 2021-12-10 DIAGNOSIS — F339 Major depressive disorder, recurrent, unspecified: Secondary | ICD-10-CM

## 2021-12-10 DIAGNOSIS — E1165 Type 2 diabetes mellitus with hyperglycemia: Secondary | ICD-10-CM

## 2021-12-10 DIAGNOSIS — R451 Restlessness and agitation: Secondary | ICD-10-CM

## 2021-12-10 DIAGNOSIS — F0280 Dementia in other diseases classified elsewhere without behavioral disturbance: Secondary | ICD-10-CM

## 2021-12-10 DIAGNOSIS — Z515 Encounter for palliative care: Secondary | ICD-10-CM

## 2021-12-10 NOTE — Telephone Encounter (Signed)
Spoke with patient's son [name] and scheduled a mychart Palliative Consult for 12/10/21 @ 1:30 PM.  ? ?Consent obtained; updated Netsmart, Team List and Epic.  ? ?

## 2021-12-10 NOTE — Telephone Encounter (Signed)
Attempted to contact patient's son [name] to schedule a Palliative Care consult appointment. No answer left a message to return call.  ?

## 2021-12-10 NOTE — Telephone Encounter (Signed)
Spoke with pt's, Cole Harris, notifying him the form was faxed to [employer] at [phone].  Verbalizes understanding and expresses his thanks.  ?

## 2021-12-10 NOTE — Progress Notes (Signed)
? ? ?Civil engineer, contracting ?Community Palliative Care Consult Note ?Telephone: [phone]  ?Fax: [phone] ? ?PATIENT NAME: Cole Harris ?[address] ?[place] [address] ?[phone] (home)  ?DOB: 12/29/1945 ?MRN: [id number] ? ?PRIMARY CARE PROVIDER:    ?Eustaquio Boyden, MD,  ?[address] [place] [address] ?[phone] ? ?REFERRING PROVIDER:   ?Eustaquio Boyden, MD ?[address] [place] ?[place],  [place] [address] ?[phone] ? ?RESPONSIBLE PARTY:   [name]  ?[name] and his wife work from home ?Contact Information   ? ? Name Relation Home Work Mobile  ? Zigmond, Trela Son [phone]    ? Beahm,karen Daughter   [phone]  ? KAREE, CHRISTOPHERSON   [phone]  ? ?  ? ? ?TELEHEALTH VISIT STATEMENT ?Due to the COVID-19 crisis, this visit was done via telemedicine from my office and it was initiated and consent by this patient and or family.  ?I connected with patient OR PROXY by a telephone/video  and verified that I am speaking with the correct person. I discussed the limitations of evaluation and management by telemedicine. The patient expressed understanding and agreed to proceed. ? ?Palliative Care was asked to follow this patient to address advance care planning, complex medical decision making and goals of care clarification. [name] is with patient during visit. This is the initial visit.  ? ?  ASSESSMENT AND / RECOMMENDATIONS:  ? ?Advance Care Planning: Our advance care planning conversation included a discussion about:    ?The value and importance of advance care planning  ?Difference between Hospice and Palliative care ?Exploration of goals of care in the event of a sudden injury or illness  ?Identification and preparation of a healthcare agent  ?Review and updating or creation of an  advance directive document . ?Decision not to resuscitate or to de-escalate disease focused treatments due to poor prognosis. ? ?CODE STATUS: Discussion on implications of code status. Patient  is a Full code.  ? ?Goals of Care: Goals include to maximize quality of life and symptom management ? ?I spent  16 minutes providing this initial consultation. More than 50% of the time in this consultation was spent on counseling patient and coordinating communication. ?-------------------------------------------------------------------------------------------------------------------------------------- ? ?Symptom Management/Plan: ?Alzheimer dementia:Early onset, worsening.  Memory loss and confusion is progressive in line  with Dementia disease trajectory. Ambulatory within his home, incontinent of bowel and bladder, FAST D. Continue Memantine and Donepezil as ordered.  Optimize ongoing supportive care.  Follow up Neurologist as ordered.  ?Agitation: Managed with Deparkote and Risperdal. Continue redirection, deescalation techniques ?Depression: Managed with Wellbutrin and Lexapro.  ?Type 2 DM: Managed with Metformin.  [name] reports patient is no longer on Insulin. A1c 8.1 12/03/21.  ?Follow up: Palliative care will continue to follow for complex medical decision making, advance care planning, and clarification of goals. Return 6 weeks or prn. Encouraged to call provider sooner with any concerns.  ? ?Family /Caregiver/Community Supports: Patient lives at home with his son [name]/family. Strong family support system identified. PCP has ordered Home health service for patient. [name] is following up on start date.  ? ?HOSPICE ELIGIBILITY/DIAGNOSIS: TBD ? ?Chief Complaint: Initial Palliative care visit ? ?HISTORY OF PRESENT ILLNESS:  Cole Harris is a 76 y.o. year old male  with multiple morbidities requiring close monitoring and with high risk of complications and  mortality: Alzheimer dementia, depression, agitation, type 2 diabetes mellitus. ?History obtained from review of EMR, discussion with primary team, caregiver, family and/or Cole Harris.  Patient in no acute distress.  Assessment required independent  historian -[name] because of patient's cognitive deficits. ?Review and summarization of Epic records shows history from other than patient. Rest of 10 point ROS asked and negative.  ?Independent interpretation of tests and reviewed as needed, available labs, patient records, imaging, studies and related documents from the EMR. ? ? ?PAST MEDICAL HISTORY:  ?Active Ambulatory Problems  ?  Diagnosis Date Noted  ? Alzheimer's disease with early onset (HCC) 08/24/2017  ? Vertebrobasilar insufficiency 08/24/2017  ? CAD (coronary artery disease) 08/24/2017  ? Dyslipidemia associated with type 2 diabetes mellitus (HCC) 08/24/2017  ? Pulmonary hypertension (HCC) 08/24/2017  ? Hypothyroidism 08/24/2017  ? Vitamin B12 deficiency 08/16/2017  ? Type 2 diabetes mellitus with hyperglycemia, with long-term current use of insulin (HCC) 05/10/2018  ? OSA treated with BiPAP 02/24/2019  ? Nontraumatic rupture of right proximal biceps tendon 03/29/2019  ? GERD (gastroesophageal reflux disease) 12/05/2020  ? Glycosuria 06/22/2021  ? Acute lower UTI 07/01/2021  ? Medicare annual wellness visit, subsequent 12/05/2021  ? Advanced directives, counseling/discussion 12/05/2021  ? Urinary incontinence 12/05/2021  ? Bowel incontinence 12/05/2021  ? ?Resolved Ambulatory Problems  ?  Diagnosis Date Noted  ? Sepsis (HCC) 06/30/2021  ? ?Past Medical History:  ?Diagnosis Date  ? Arthritis   ? Depression   ? Diabetes mellitus without complication (HCC)   ? Hyperlipidemia   ? Hypertension   ? ? ?SOCIAL HX:  ?Social History  ? ?Tobacco Use  ? Smoking status: Former  ? Smokeless tobacco: Never  ?Substance Use Topics  ? Alcohol use: No  ? ?  ?FAMILY HX:  ?Family History  ?Problem Relation Age of Onset  ? Arthritis Mother   ? Arthritis Father   ? Hearing loss Father   ? Heart disease Father   ? Hyperlipidemia Father   ?   ? ?ALLERGIES: No Known Allergies   ? ?PERTINENT MEDICATIONS:  ?Outpatient Encounter Medications as of 12/10/2021  ?Medication Sig  ?  acetaminophen (TYLENOL) 325 MG tablet Take 2 tablets (650 mg total) by mouth every 6 (six) hours as needed for mild pain or moderate pain (or Fever).  ? buPROPion (WELLBUTRIN XL) 150 MG 24 hr tablet TAKE ONE TABLET BY MOUTH DAILY  ? divalproex (DEPAKOTE) 125 MG DR tablet Take 125 mg by mouth 2 (two) times daily. 1 in AM, 1 at noon, 2 at bedtime  ? escitalopram (LEXAPRO) 20 MG tablet TAKE ONE TABLET BY MOUTH DAILY  ? Memantine HCl-Donepezil HCl 28-10 MG CP24 Take 1 capsule by mouth daily.  ? metFORMIN (GLUCOPHAGE-XR) 500 MG 24 hr tablet TAKE 1 TABLET BY MOUTH EVERY DAY WITH BREAKFAST  ? naproxen sodium (ALEVE) 220 MG tablet Take 1 tablet (220 mg total) by mouth daily as needed.  ? omeprazole (PRILOSEC) 20 MG capsule Take 1 capsule (20 mg total) by mouth daily. For reflux/heartburn  ? risperiDONE (RISPERDAL) 1 MG tablet Take 1 mg by mouth at bedtime.  ? rosuvastatin (CRESTOR) 20 MG tablet TAKE ONE TABLET BY MOUTH AT BEDTIME  ? vitamin B-12 (CYANOCOBALAMIN) 1000 MCG tablet Take 1,000 mcg by mouth daily.  ? ?No facility-administered encounter medications on file as of 12/10/2021.  ? ? ?Thank you for the opportunity to participate in the care of Cole Harris.  The palliative care team will continue to follow. Please call our office at [phone] if we can be of additional assistance.  ? ?Note: Portions of this note were generated with Scientist, clinical (histocompatibility and immunogenetics). Dictation errors may occur despite best attempts at  proofreading. ? ?Rosaura CarpenterLivina I Olympia Adelsberger, NP  ? ?  ?

## 2021-12-10 NOTE — Telephone Encounter (Signed)
Pt son called in wanted to know status about [facility] healthcare would like a call back if there is updates #[phone]  ?

## 2021-12-21 ENCOUNTER — Telehealth: Payer: Medicare Other

## 2021-12-24 ENCOUNTER — Other Ambulatory Visit: Payer: Self-pay | Admitting: Family Medicine

## 2021-12-25 ENCOUNTER — Encounter: Payer: Self-pay | Admitting: Family Medicine

## 2021-12-25 NOTE — Telephone Encounter (Signed)
Wellbutrin last filled:  12/21/21, #90 ?Lexapro last filled:  12/21/21, #90 ?Last OV:  12/02/21, AWV ?Next OV:  none ? ?Looks like furosemide was stopped.   ?

## 2021-12-26 ENCOUNTER — Inpatient Hospital Stay (HOSPITAL_COMMUNITY)
Admission: EM | Admit: 2021-12-26 | Discharge: 2021-12-28 | DRG: 640 | Disposition: A | Discharge status: Hospice | Payer: Medicare Other | Attending: Internal Medicine | Admitting: Internal Medicine

## 2021-12-26 ENCOUNTER — Emergency Department (HOSPITAL_COMMUNITY): Payer: Medicare Other

## 2021-12-26 DIAGNOSIS — I10 Essential (primary) hypertension: Secondary | ICD-10-CM | POA: Diagnosis present

## 2021-12-26 DIAGNOSIS — G9340 Encephalopathy, unspecified: Secondary | ICD-10-CM | POA: Diagnosis not present

## 2021-12-26 DIAGNOSIS — E1165 Type 2 diabetes mellitus with hyperglycemia: Secondary | ICD-10-CM

## 2021-12-26 DIAGNOSIS — G300 Alzheimer's disease with early onset: Secondary | ICD-10-CM

## 2021-12-26 DIAGNOSIS — F0280 Dementia in other diseases classified elsewhere without behavioral disturbance: Secondary | ICD-10-CM

## 2021-12-26 DIAGNOSIS — E785 Hyperlipidemia, unspecified: Secondary | ICD-10-CM | POA: Diagnosis present

## 2021-12-26 DIAGNOSIS — I272 Pulmonary hypertension, unspecified: Secondary | ICD-10-CM | POA: Diagnosis present

## 2021-12-26 DIAGNOSIS — E860 Dehydration: Secondary | ICD-10-CM | POA: Diagnosis not present

## 2021-12-26 DIAGNOSIS — Z7401 Bed confinement status: Secondary | ICD-10-CM

## 2021-12-26 DIAGNOSIS — G253 Myoclonus: Secondary | ICD-10-CM | POA: Diagnosis present

## 2021-12-26 DIAGNOSIS — K219 Gastro-esophageal reflux disease without esophagitis: Secondary | ICD-10-CM

## 2021-12-26 DIAGNOSIS — Z87891 Personal history of nicotine dependence: Secondary | ICD-10-CM

## 2021-12-26 DIAGNOSIS — G4733 Obstructive sleep apnea (adult) (pediatric): Secondary | ICD-10-CM

## 2021-12-26 DIAGNOSIS — D72829 Elevated white blood cell count, unspecified: Secondary | ICD-10-CM | POA: Diagnosis not present

## 2021-12-26 DIAGNOSIS — F32A Depression, unspecified: Secondary | ICD-10-CM | POA: Diagnosis not present

## 2021-12-26 DIAGNOSIS — Z79899 Other long term (current) drug therapy: Secondary | ICD-10-CM

## 2021-12-26 DIAGNOSIS — I2510 Atherosclerotic heart disease of native coronary artery without angina pectoris: Secondary | ICD-10-CM | POA: Diagnosis present

## 2021-12-26 DIAGNOSIS — G9341 Metabolic encephalopathy: Secondary | ICD-10-CM | POA: Diagnosis present

## 2021-12-26 DIAGNOSIS — Z7984 Long term (current) use of oral hypoglycemic drugs: Secondary | ICD-10-CM

## 2021-12-26 DIAGNOSIS — E669 Obesity, unspecified: Secondary | ICD-10-CM | POA: Diagnosis present

## 2021-12-26 DIAGNOSIS — Z789 Other specified health status: Secondary | ICD-10-CM

## 2021-12-26 DIAGNOSIS — Z7189 Other specified counseling: Secondary | ICD-10-CM

## 2021-12-26 DIAGNOSIS — Z8261 Family history of arthritis: Secondary | ICD-10-CM

## 2021-12-26 DIAGNOSIS — Z66 Do not resuscitate: Secondary | ICD-10-CM | POA: Diagnosis present

## 2021-12-26 DIAGNOSIS — F0283 Dementia in other diseases classified elsewhere, unspecified severity, with mood disturbance: Secondary | ICD-10-CM | POA: Diagnosis present

## 2021-12-26 DIAGNOSIS — R262 Difficulty in walking, not elsewhere classified: Secondary | ICD-10-CM | POA: Diagnosis present

## 2021-12-26 DIAGNOSIS — R1310 Dysphagia, unspecified: Secondary | ICD-10-CM | POA: Diagnosis present

## 2021-12-26 DIAGNOSIS — R4182 Altered mental status, unspecified: Secondary | ICD-10-CM

## 2021-12-26 DIAGNOSIS — Z8249 Family history of ischemic heart disease and other diseases of the circulatory system: Secondary | ICD-10-CM

## 2021-12-26 DIAGNOSIS — Z515 Encounter for palliative care: Secondary | ICD-10-CM

## 2021-12-26 DIAGNOSIS — Z91199 Patient's noncompliance with other medical treatment and regimen due to unspecified reason: Secondary | ICD-10-CM

## 2021-12-26 DIAGNOSIS — Z6839 Body mass index (BMI) 39.0-39.9, adult: Secondary | ICD-10-CM

## 2021-12-26 DIAGNOSIS — F0282 Dementia in other diseases classified elsewhere, unspecified severity, with psychotic disturbance: Secondary | ICD-10-CM | POA: Diagnosis present

## 2021-12-26 DIAGNOSIS — Z83438 Family history of other disorder of lipoprotein metabolism and other lipidemia: Secondary | ICD-10-CM

## 2021-12-26 LAB — URINALYSIS, ROUTINE W REFLEX MICROSCOPIC
Bilirubin Urine: NEGATIVE
Glucose, UA: NEGATIVE mg/dL
Hgb urine dipstick: NEGATIVE
Ketones, ur: NEGATIVE mg/dL
Leukocytes,Ua: NEGATIVE
Nitrite: NEGATIVE
Protein, ur: NEGATIVE mg/dL
Specific Gravity, Urine: 1.018 (ref 1.005–1.030)
pH: 5 (ref 5.0–8.0)

## 2021-12-26 LAB — CBC WITH DIFFERENTIAL/PLATELET
Abs Immature Granulocytes: 0.03 10*3/uL (ref 0.00–0.07)
Basophils Absolute: 0.1 10*3/uL (ref 0.0–0.1)
Basophils Relative: 1 %
Eosinophils Absolute: 0.2 10*3/uL (ref 0.0–0.5)
Eosinophils Relative: 2 %
HCT: 42.1 % (ref 39.0–52.0)
Hemoglobin: 14.3 g/dL (ref 13.0–17.0)
Immature Granulocytes: 0 %
Lymphocytes Relative: 31 %
Lymphs Abs: 3.3 10*3/uL (ref 0.7–4.0)
MCH: 32.6 pg (ref 26.0–34.0)
MCHC: 34 g/dL (ref 30.0–36.0)
MCV: 95.9 fL (ref 80.0–100.0)
Monocytes Absolute: 1 10*3/uL (ref 0.1–1.0)
Monocytes Relative: 9 %
Neutro Abs: 6.1 10*3/uL (ref 1.7–7.7)
Neutrophils Relative %: 57 %
Platelets: 195 10*3/uL (ref 150–400)
RBC: 4.39 MIL/uL (ref 4.22–5.81)
RDW: 12.8 % (ref 11.5–15.5)
WBC: 10.6 10*3/uL — ABNORMAL HIGH (ref 4.0–10.5)
nRBC: 0 % (ref 0.0–0.2)

## 2021-12-26 LAB — I-STAT ARTERIAL BLOOD GAS, ED
Acid-Base Excess: 2 mmol/L (ref 0.0–2.0)
Bicarbonate: 26.5 mmol/L (ref 20.0–28.0)
Calcium, Ion: 1.34 mmol/L (ref 1.15–1.40)
HCT: 41 % (ref 39.0–52.0)
Hemoglobin: 13.9 g/dL (ref 13.0–17.0)
O2 Saturation: 94 %
Patient temperature: 98
Potassium: 3.6 mmol/L (ref 3.5–5.1)
Sodium: 137 mmol/L (ref 135–145)
TCO2: 28 mmol/L (ref 22–32)
pCO2 arterial: 40.7 mmHg (ref 32–48)
pH, Arterial: 7.42 (ref 7.35–7.45)
pO2, Arterial: 70 mmHg — ABNORMAL LOW (ref 83–108)

## 2021-12-26 LAB — CBG MONITORING, ED: Glucose-Capillary: 193 mg/dL — ABNORMAL HIGH (ref 70–99)

## 2021-12-26 LAB — GLUCOSE, CAPILLARY
Glucose-Capillary: 135 mg/dL — ABNORMAL HIGH (ref 70–99)
Glucose-Capillary: 285 mg/dL — ABNORMAL HIGH (ref 70–99)

## 2021-12-26 LAB — I-STAT CHEM 8, ED
BUN: 17 mg/dL (ref 8–23)
Calcium, Ion: 1.31 mmol/L (ref 1.15–1.40)
Chloride: 100 mmol/L (ref 98–111)
Creatinine, Ser: 0.8 mg/dL (ref 0.61–1.24)
Glucose, Bld: 163 mg/dL — ABNORMAL HIGH (ref 70–99)
HCT: 42 % (ref 39.0–52.0)
Hemoglobin: 14.3 g/dL (ref 13.0–17.0)
Potassium: 3.9 mmol/L (ref 3.5–5.1)
Sodium: 137 mmol/L (ref 135–145)
TCO2: 26 mmol/L (ref 22–32)

## 2021-12-26 LAB — SALICYLATE LEVEL: Salicylate Lvl: <7 mg/dL — ABNORMAL LOW (ref 7.0–30.0)

## 2021-12-26 LAB — VALPROIC ACID LEVEL: Valproic Acid Lvl: <10 ug/mL — ABNORMAL LOW (ref 50.0–100.0)

## 2021-12-26 LAB — AMMONIA: Ammonia: 39 umol/L — ABNORMAL HIGH (ref 9–35)

## 2021-12-26 LAB — ACETAMINOPHEN LEVEL: Acetaminophen (Tylenol), Serum: <10 ug/mL — ABNORMAL LOW (ref 10–30)

## 2021-12-26 LAB — TSH: TSH: 1.501 u[IU]/mL (ref 0.350–4.500)

## 2021-12-26 MED ORDER — ROSUVASTATIN CALCIUM 20 MG PO TABS
20.0000 mg | ORAL_TABLET | Freq: Every day | ORAL | Status: DC
  Administered 2021-12-27 – 2021-12-28 (×2): 20 mg via ORAL
  Filled 2021-12-26 (×3): qty 1

## 2021-12-26 MED ORDER — ALBUTEROL SULFATE (2.5 MG/3ML) 0.083% IN NEBU: 2.5000 mg | INHALATION_SOLUTION | Freq: Four times a day (QID) | RESPIRATORY_TRACT | Status: DC | PRN

## 2021-12-26 MED ORDER — DIVALPROEX SODIUM 125 MG PO DR TAB
125.0000 mg | DELAYED_RELEASE_TABLET | Freq: Every morning | ORAL | Status: DC
  Filled 2021-12-26: qty 1

## 2021-12-26 MED ORDER — MEMANTINE HCL-DONEPEZIL HCL ER 28-10 MG PO CP24: 1.0000 | ORAL_CAPSULE | Freq: Every day | ORAL | Status: DC

## 2021-12-26 MED ORDER — MEMANTINE HCL ER 28 MG PO CP24
28.0000 mg | ORAL_CAPSULE | Freq: Every day | ORAL | Status: DC
  Filled 2021-12-26: qty 1

## 2021-12-26 MED ORDER — SODIUM CHLORIDE 0.9 % IV SOLN
INTRAVENOUS | Status: DC
Start: 2021-12-26 — End: 2021-12-28

## 2021-12-26 MED ORDER — PANTOPRAZOLE SODIUM 40 MG PO TBEC
40.0000 mg | DELAYED_RELEASE_TABLET | Freq: Every day | ORAL | Status: DC
  Administered 2021-12-26 – 2021-12-28 (×3): 40 mg via ORAL
  Filled 2021-12-26 (×3): qty 1

## 2021-12-26 MED ORDER — ESCITALOPRAM OXALATE 20 MG PO TABS
20.0000 mg | ORAL_TABLET | Freq: Every day | ORAL | Status: DC
  Administered 2021-12-27 – 2021-12-28 (×2): 20 mg via ORAL
  Filled 2021-12-26: qty 2
  Filled 2021-12-26 (×2): qty 1

## 2021-12-26 MED ORDER — ONDANSETRON HCL 4 MG PO TABS
4.0000 mg | ORAL_TABLET | Freq: Four times a day (QID) | ORAL | Status: DC | PRN
Start: 2021-12-26 — End: 2021-12-28

## 2021-12-26 MED ORDER — MEMANTINE HCL 10 MG PO TABS
10.0000 mg | ORAL_TABLET | Freq: Two times a day (BID) | ORAL | Status: DC
  Administered 2021-12-26 – 2021-12-28 (×4): 10 mg via ORAL
  Filled 2021-12-26 (×7): qty 1

## 2021-12-26 MED ORDER — BUPROPION HCL ER (XL) 150 MG PO TB24
150.0000 mg | ORAL_TABLET | Freq: Every day | ORAL | Status: DC
  Filled 2021-12-26 (×2): qty 1

## 2021-12-26 MED ORDER — DIVALPROEX SODIUM 125 MG PO DR TAB: 125.0000 mg | DELAYED_RELEASE_TABLET | ORAL | Status: DC

## 2021-12-26 MED ORDER — ACETAMINOPHEN 325 MG PO TABS: 650.0000 mg | ORAL_TABLET | Freq: Four times a day (QID) | ORAL | Status: DC | PRN

## 2021-12-26 MED ORDER — ACETAMINOPHEN 650 MG RE SUPP: 650.0000 mg | Freq: Four times a day (QID) | RECTAL | Status: DC | PRN

## 2021-12-26 MED ORDER — SODIUM CHLORIDE 0.9% FLUSH
3.0000 mL | Freq: Two times a day (BID) | INTRAVENOUS | Status: DC
  Administered 2021-12-27 – 2021-12-28 (×3): 3 mL via INTRAVENOUS

## 2021-12-26 MED ORDER — ENOXAPARIN SODIUM 60 MG/0.6ML IJ SOSY
0.5000 mg/kg | PREFILLED_SYRINGE | INTRAMUSCULAR | Status: DC
  Administered 2021-12-26 – 2021-12-28 (×3): 57.5 mg via SUBCUTANEOUS
  Filled 2021-12-26: qty 0.6
  Filled 2021-12-26: qty 0.57
  Filled 2021-12-26: qty 0.6

## 2021-12-26 MED ORDER — DIVALPROEX SODIUM 125 MG PO CSDR
125.0000 mg | DELAYED_RELEASE_CAPSULE | Freq: Three times a day (TID) | ORAL | Status: DC
  Administered 2021-12-26 – 2021-12-27 (×2): 125 mg via ORAL
  Filled 2021-12-26 (×5): qty 1

## 2021-12-26 MED ORDER — RISPERIDONE 0.25 MG PO TABS
0.2500 mg | ORAL_TABLET | Freq: Every day | ORAL | Status: DC
Start: 2021-12-26 — End: 2021-12-28
  Administered 2021-12-26 – 2021-12-27 (×2): 0.25 mg via ORAL
  Filled 2021-12-26 (×3): qty 1

## 2021-12-26 MED ORDER — BUPROPION HCL 75 MG PO TABS
75.0000 mg | ORAL_TABLET | Freq: Two times a day (BID) | ORAL | Status: DC
  Administered 2021-12-26 – 2021-12-28 (×4): 75 mg via ORAL
  Filled 2021-12-26 (×6): qty 1

## 2021-12-26 MED ORDER — INSULIN ASPART 100 UNIT/ML IJ SOLN
0.0000 [IU] | Freq: Three times a day (TID) | INTRAMUSCULAR | Status: DC
  Administered 2021-12-26: 3 [IU] via SUBCUTANEOUS
  Administered 2021-12-26 – 2021-12-27 (×3): 2 [IU] via SUBCUTANEOUS
  Administered 2021-12-27 – 2021-12-28 (×2): 3 [IU] via SUBCUTANEOUS
  Administered 2021-12-28: 2 [IU] via SUBCUTANEOUS

## 2021-12-26 MED ORDER — ONDANSETRON HCL 4 MG/2ML IJ SOLN: 4.0000 mg | Freq: Four times a day (QID) | INTRAMUSCULAR | Status: DC | PRN

## 2021-12-26 MED ORDER — DONEPEZIL HCL 10 MG PO TABS
10.0000 mg | ORAL_TABLET | Freq: Every day | ORAL | Status: DC
  Administered 2021-12-26 – 2021-12-28 (×3): 10 mg via ORAL
  Filled 2021-12-26 (×3): qty 1

## 2021-12-26 MED ORDER — DIVALPROEX SODIUM 250 MG PO DR TAB: 250.0000 mg | DELAYED_RELEASE_TABLET | Freq: Every day | ORAL | Status: DC

## 2021-12-26 NOTE — H&P (Signed)
?History and Physical  ? ? ?Patient: Cole Harris WGN:[id number] DOB: 03/06/1946 ?DOA: 12/26/2021 ?DOS: the patient was seen and examined on 12/26/2021 ?PCP: Eustaquio BoydenGutierrez, Javier, MD  ?Patient coming from: Home ? ?Chief Complaint:  ?Chief Complaint  ?Patient presents with  ? Altered Mental Status  ?  Per pt family, believes that pt has an urinary tract infection. Pt is on hospice   ? ?HPI: Cole ChaletCharles V Gurevich is a 76 y.o. male with medical history significant of hypertension, hyperlipidemia, pulmonary hypertension, diabetes mellitus type 2, Alzheimer's disease, depression, back pain s/p spinal stimulator and GERD who presents with with a couple days of not feeling well.  History is obtained from the patient's son who is present at bedside due to the patient's dementia.  At baseline patient is able to recognize family, but is not oriented to time place always.  He is able to usually ambulate with assistance from his son at his side.  However, recently patient was noted to be sleeping more, was refusing to ambulate, and seemed to be more confused.  Patient had been admitted into the hospital 05/2021 with sepsis for UTI.  The patient's son was concerned that he had a urinary tract infection and checked a home urine dipstick test that gave concern for leukoesterase.  He reports that his father was not wanting to eat or drink over the last day and last night was completely altered and did not know who anyone was.  They had 1 televisit with palliative care recently.  The patient's son did not want to wait until things got bad like last time and brought him to the hospital for further evaluation. Does not report any recent fever or complaints from the patient.  Family did report that they have been brushing his meds. ? ?Upon admission into the emergency department patient was noted to be afebrile with stable vital signs.  Labs significant for WBC 10.6, BUN 17, and creatinine 0.8.  Urinalysis did not show any signs of infection.   Chest x-ray was otherwise noted to be clear and CT scan of the head did not note any acute abnormality.  MRI of the brain at been ordered and pending.  TRH called to admit for altered mental status. ? ?Review of Systems: Unable to review due to patient's history of dementia. ?Past Medical History:  ?Diagnosis Date  ? Alzheimer's disease with early onset (HCC) 08/24/2017  ? Arthritis   ? CAD (coronary artery disease) 08/24/2017  ? Depression   ? Diabetes mellitus without complication (HCC)   ? GERD (gastroesophageal reflux disease)   ? Hyperlipidemia   ? Hypertension   ? Pulmonary hypertension (HCC) 08/24/2017  ? Vertebrobasilar insufficiency 08/24/2017  ? ?Past Surgical History:  ?Procedure Laterality Date  ? BACK SURGERY  1998  ? INTERNAL TENS UNIT  2002  ? TONSILLECTOMY  1955  ? ?Social History:  reports that he has quit smoking. He has never used smokeless tobacco. He reports that he does not drink alcohol and does not use drugs. ? ?No Known Allergies ? ?Family History  ?Problem Relation Age of Onset  ? Arthritis Mother   ? Arthritis Father   ? Hearing loss Father   ? Heart disease Father   ? Hyperlipidemia Father   ? ? ?Prior to Admission medications   ?Medication Sig Start Date End Date Taking? Authorizing Provider  ?acetaminophen (TYLENOL) 325 MG tablet Take 2 tablets (650 mg total) by mouth every 6 (six) hours as needed for mild pain or  moderate pain (or Fever). 07/04/21  Yes Arrien, York Ram, MD  ?buPROPion (WELLBUTRIN XL) 150 MG 24 hr tablet TAKE ONE TABLET BY MOUTH DAILY ?Patient taking differently: Take 150 mg by mouth daily. 09/23/21  Yes Eustaquio Boyden, MD  ?divalproex (DEPAKOTE) 125 MG DR tablet Take 125 mg by mouth See admin instructions. 125 mg in the morning  ?250 mg at  bedtime 04/07/21  Yes [provider]  ?escitalopram (LEXAPRO) 20 MG tablet TAKE ONE TABLET BY MOUTH DAILY ?Patient taking differently: Take 20 mg by mouth daily. 09/23/21  Yes Eustaquio Boyden, MD  ?Memantine  HCl-Donepezil HCl 28-10 MG CP24 Take 1 capsule by mouth daily.   Yes [provider]  ?metFORMIN (GLUCOPHAGE-XR) 500 MG 24 hr tablet TAKE 1 TABLET BY MOUTH EVERY DAY WITH BREAKFAST ?Patient taking differently: Take 500 mg by mouth daily with breakfast. 12/02/21  Yes Eustaquio Boyden, MD  ?naproxen sodium (ALEVE) 220 MG tablet Take 1 tablet (220 mg total) by mouth daily as needed. ?Patient taking differently: Take 220 mg by mouth daily as needed (pain/headache). 12/02/21  Yes Eustaquio Boyden, MD  ?omeprazole (PRILOSEC) 20 MG capsule Take 1 capsule (20 mg total) by mouth daily. For reflux/heartburn ?Patient taking differently: Take 20 mg by mouth daily. 12/02/21  Yes Eustaquio Boyden, MD  ?risperiDONE (RISPERDAL) 0.25 MG tablet Take 0.25 mg by mouth at bedtime. 11/27/21  Yes [provider]  ?rosuvastatin (CRESTOR) 20 MG tablet TAKE ONE TABLET BY MOUTH AT BEDTIME ?Patient taking differently: Take 20 mg by mouth daily. 10/22/21  Yes Eustaquio Boyden, MD  ?vitamin B-12 (CYANOCOBALAMIN) 1000 MCG tablet Take 1,000 mcg by mouth daily.   Yes [provider]  ? ? ?Physical Exam: ?Vitals:  ? 12/26/21 0330 12/26/21 0345 12/26/21 0400 12/26/21 0415  ?BP: (!) 101/91 112/62 124/65 121/66  ?Pulse: 71 65 66 65  ?Resp:      ?Temp:      ?TempSrc:      ?SpO2: 95% 95% 94% 94%  ?Weight:      ?Height:      ? ?Constitutional: Obese elderly male who appears to be in acute distress ?Eyes: PERRL, lids and conjunctivae normal ?ENMT: Mucous membranes are moist with patient chewing food ?Neck: normal, supple, no masses, no thyromegaly ?Respiratory: clear to auscultation bilaterally, no wheezing, no crackles. Normal respiratory effort.   ?Cardiovascular: Regular rate and rhythm. No extremity edema. 2+ pedal pulses.   ?Abdomen: Protuberant abdomen without tenderness to palpation.  Bowel sounds appreciated. ?Musculoskeletal:  No joint deformity upper and lower extremities. Good ROM, no contractures.   ?Skin: no rashes,  lesions, ulcers. No induration ?Neurologic: CN 2-12 grossly intact.  Patient able to move all extremities. ?Psychiatric: Alert and oriented to self.  Agitated mood. ? ?Data Reviewed: ? ?Reviewed labs, imaging, studies, and pertinent record as noted above in HPI ? ?Assessment and Plan: ?Altered mental status ?Patient presents after being reported to be less responsive, unable to walk, and unable to recognize family.  Urinalysis did not show any signs of infection and chest x-ray was otherwise noted to be clear.  ABG was compensated, and did not show any signs of hypercapnia.  Question if symptoms secondary to a medication versus dehydration. ?-Admit to a medical telemetry ?-Neuro check ?-Check Depakote, salicylate, and acetaminophen levels ?-Check TSH ?-Follow-up MRI of the brain, if able to be obtained as patient has spinal stimulator. ?-Palliative care consulted to help with goals of care ? ?Leukocytosis ?Acute.  WBC just mild elevated 10.6.  Unclear  cause of symptoms at this time.  Work-up has otherwise been negative so far. ?-Recheck CBC tomorrow ? ?Dehydration ?On admission BUN 17 with creatinine 0.8.  BUN to creatinine ratio greater than 20 to suggest dehydration. ?-Normal saline IV fluids at 75 mL/h ? ?Alzheimer's dementia ?At baseline patient is usually able to walk with his son's assistance beside him and is usually able to recognize family. ?-Delirium precautions ?-Continue pharmacy adjustment for memantine-donepezil, Depakote, and risperidone as home medications should not be crushed.  Patient needs prescription sent his meds that can be crushed ? ?Diabetes mellitus type 2, uncontrolled ?On admission blood sugar elevated to 163.  Last hemoglobin A1c 8.1 on 12/03/2021.  Home medication regimen includes metformin XR 500 mg daily. ?-Hypoglycemic protocol ?-Hold metformin ?-CBGs before every meal with moderate SSI ?-Adjust regimen as needed ? ?Depression ?-Continue pharmacy substitution due to need of patient's  meds to be crushed Wellbutrin and Lexapro ? ?Hyperlipidemia ?-Continue Crestor ? ?GERD ?-Pharmacy substitution of Protonix for omeprazole ? ?Nephrolithiasis ? ?OSA ?Patient is supposed to be on CPAP/BiPAP at nig

## 2021-12-26 NOTE — Progress Notes (Signed)
Regarding the current status of this pt's currently ordered MRI. We have little information regarding the pt's currently implanted spinal cord stimulator device which to safely do the exam, we would need, model numbers for the device, as well as remote and chargers for the device. Spoke with the family in hopes to obtain as much information as possible regarding the device and they were able to provide that the device manufacturer is [employer] based on the device remote which they do have. They were also able to tell me that it was implanted in they believe 1998 in [place] by a Dr. Selena Batten. Currently attempting to get in touch with [employer] to see if I can get the device model numbers that way but this can be challenging on the weekend as normal [employer] phone hours are M-F 8am-5pm CST There is also the issue of the pt not being currently A/O, where the vast majority of Stimulators that are considered conditional for MRI have the condition that the pt be both A/O and able to provide feedback during the exam in the event of burning, heating, pulling, or other discomfort that may occur. Before we can safely perform this pt's exam we will need the info for the device (model numbers for generator and leads), device remote, device chargers, both device and remote fully charged, and, if device is deemed conditional for MRI, pt condition to improve to an A/O state to provide feedback during the exam. SCed ordering doc regarding the situation. Attempted to make RN aware but no answer at this time will attempt to inform RN later. ?

## 2021-12-26 NOTE — ED Provider Notes (Signed)
?[facility] EMERGENCY DEPARTMENT ?Provider Note ? ? ?CSN: [id number] ?Arrival date & time: 12/26/21  3220 ? ?  ? ?History ? ?Chief Complaint  ?Patient presents with  ? Altered Mental Status  ?  Per pt family, believes that pt has an urinary tract infection. Pt is on hospice   ? ? ?Cole Harris is a 76 y.o. male. ? ?The history is provided by the patient.  ?Altered Mental Status ?Presenting symptoms: confusion and partial responsiveness   ?Presenting symptoms: no memory loss   ?Severity:  Moderate ?Most recent episode:  Today ?Episode history:  Single ?Duration:  1 day ?Timing:  Constant ?Progression:  Worsening ?Chronicity:  New ?Context: dementia   ?Associated symptoms: no fever   ? ?  ? ?Home Medications ?Prior to Admission medications   ?Medication Sig Start Date End Date Taking? Authorizing Provider  ?acetaminophen (TYLENOL) 325 MG tablet Take 2 tablets (650 mg total) by mouth every 6 (six) hours as needed for mild pain or moderate pain (or Fever). 07/04/21   Arrien, York Ram, MD  ?buPROPion (WELLBUTRIN XL) 150 MG 24 hr tablet TAKE ONE TABLET BY MOUTH DAILY 09/23/21   Eustaquio Boyden, MD  ?divalproex (DEPAKOTE) 125 MG DR tablet Take 125 mg by mouth 2 (two) times daily. 1 in AM, 1 at noon, 2 at bedtime 04/07/21   [provider]  ?escitalopram (LEXAPRO) 20 MG tablet TAKE ONE TABLET BY MOUTH DAILY 09/23/21   Eustaquio Boyden, MD  ?Memantine HCl-Donepezil HCl 28-10 MG CP24 Take 1 capsule by mouth daily.    [provider]  ?metFORMIN (GLUCOPHAGE-XR) 500 MG 24 hr tablet TAKE 1 TABLET BY MOUTH EVERY DAY WITH BREAKFAST 12/02/21   Eustaquio Boyden, MD  ?naproxen sodium (ALEVE) 220 MG tablet Take 1 tablet (220 mg total) by mouth daily as needed. 12/02/21   Eustaquio Boyden, MD  ?omeprazole (PRILOSEC) 20 MG capsule Take 1 capsule (20 mg total) by mouth daily. For reflux/heartburn 12/02/21   Eustaquio Boyden, MD  ?risperiDONE (RISPERDAL) 1 MG tablet Take 1 mg by mouth at bedtime.  11/27/21   [provider]  ?rosuvastatin (CRESTOR) 20 MG tablet TAKE ONE TABLET BY MOUTH AT BEDTIME 10/22/21   Eustaquio Boyden, MD  ?vitamin B-12 (CYANOCOBALAMIN) 1000 MCG tablet Take 1,000 mcg by mouth daily.    [provider]  ?   ? ?Allergies    ?Patient has no known allergies.   ? ?Review of Systems   ?Review of Systems  ?Unable to perform ROS: Mental status change  ?Constitutional:  Negative for fever.  ?HENT:  Negative for congestion.   ?Cardiovascular:  Negative for leg swelling.  ?Psychiatric/Behavioral:  Positive for confusion. Negative for memory loss.   ? ?Physical Exam ?Updated Vital Signs ?BP 121/66   Pulse 65   Temp 98 ?F (36.7 ?C) (Axillary)   Resp 16   Ht 5\' 7"  (1.702 m)   Wt 113 kg   SpO2 94%   BMI 39.02 kg/m?  ?Physical Exam ?Vitals and nursing note reviewed.  ?Constitutional:   ?   General: He is not in acute distress. ?   Appearance: Normal appearance.  ?HENT:  ?   Head: Normocephalic and atraumatic.  ?   Nose: Nose normal.  ?   Mouth/Throat:  ?   Mouth: Mucous membranes are moist.  ?Eyes:  ?   Conjunctiva/sclera: Conjunctivae normal.  ?   Pupils: Pupils are equal, round, and reactive to light.  ?Cardiovascular:  ?   Rate and  Rhythm: Normal rate and regular rhythm.  ?   Pulses: Normal pulses.  ?   Heart sounds: Normal heart sounds.  ?Pulmonary:  ?   Effort: Pulmonary effort is normal.  ?   Breath sounds: Normal breath sounds.  ?Abdominal:  ?   General: Bowel sounds are normal.  ?   Palpations: Abdomen is soft.  ?   Tenderness: There is no abdominal tenderness. There is no guarding.  ?Musculoskeletal:     ?   General: Normal range of motion.  ?   Cervical back: Normal range of motion and neck supple.  ?Skin: ?   General: Skin is warm and dry.  ?   Capillary Refill: Capillary refill takes less than 2 seconds.  ?Neurological:  ?   Mental Status: He is alert.  ?   Deep Tendon Reflexes: Reflexes normal.  ? ? ?ED Results / Procedures / Treatments   ?Labs ?(all labs ordered  are listed, but only abnormal results are displayed) ?Results for orders placed or performed during the hospital encounter of 12/26/21  ?CBC with Differential  ?Result Value Ref Range  ? WBC 10.6 (H) 4.0 - 10.5 K/uL  ? RBC 4.39 4.22 - 5.81 MIL/uL  ? Hemoglobin 14.3 13.0 - 17.0 g/dL  ? HCT 42.1 39.0 - 52.0 %  ? MCV 95.9 80.0 - 100.0 fL  ? MCH 32.6 26.0 - 34.0 pg  ? MCHC 34.0 30.0 - 36.0 g/dL  ? RDW 12.8 11.5 - 15.5 %  ? Platelets 195 150 - 400 K/uL  ? nRBC 0.0 0.0 - 0.2 %  ? Neutrophils Relative % 57 %  ? Neutro Abs 6.1 1.7 - 7.7 K/uL  ? Lymphocytes Relative 31 %  ? Lymphs Abs 3.3 0.7 - 4.0 K/uL  ? Monocytes Relative 9 %  ? Monocytes Absolute 1.0 0.1 - 1.0 K/uL  ? Eosinophils Relative 2 %  ? Eosinophils Absolute 0.2 0.0 - 0.5 K/uL  ? Basophils Relative 1 %  ? Basophils Absolute 0.1 0.0 - 0.1 K/uL  ? Immature Granulocytes 0 %  ? Abs Immature Granulocytes 0.03 0.00 - 0.07 K/uL  ?Urinalysis, Routine w reflex microscopic Urine, Catheterized  ?Result Value Ref Range  ? Color, Urine YELLOW YELLOW  ? APPearance CLEAR CLEAR  ? Specific Gravity, Urine 1.018 1.005 - 1.030  ? pH 5.0 5.0 - 8.0  ? Glucose, UA NEGATIVE NEGATIVE mg/dL  ? Hgb urine dipstick NEGATIVE NEGATIVE  ? Bilirubin Urine NEGATIVE NEGATIVE  ? Ketones, ur NEGATIVE NEGATIVE mg/dL  ? Protein, ur NEGATIVE NEGATIVE mg/dL  ? Nitrite NEGATIVE NEGATIVE  ? Leukocytes,Ua NEGATIVE NEGATIVE  ?I-stat chem 8, ED (not at [facility] or [facility])  ?Result Value Ref Range  ? Sodium 137 135 - 145 mmol/L  ? Potassium 3.9 3.5 - 5.1 mmol/L  ? Chloride 100 98 - 111 mmol/L  ? BUN 17 8 - 23 mg/dL  ? Creatinine, Ser 0.80 0.61 - 1.24 mg/dL  ? Glucose, Bld 163 (H) 70 - 99 mg/dL  ? Calcium, Ion 1.31 1.15 - 1.40 mmol/L  ? TCO2 26 22 - 32 mmol/L  ? Hemoglobin 14.3 13.0 - 17.0 g/dL  ? HCT 42.0 39.0 - 52.0 %  ? ?CT Head Wo Contrast ? ?Result Date: 12/26/2021 ?CLINICAL DATA:  Altered mental status. EXAM: CT HEAD WITHOUT CONTRAST TECHNIQUE: Contiguous axial images were obtained from the base of the skull  through the vertex without intravenous contrast. RADIATION DOSE REDUCTION: This exam was performed according to the departmental dose-optimization program  which includes automated exposure control, adjustment of the mA and/or kV according to patient size and/or use of iterative reconstruction technique. COMPARISON:  08/18/2017. FINDINGS: Brain: No acute intracranial hemorrhage, midline shift or mass effect. No extra-axial fluid collection. Diffuse atrophy is noted. Periventricular white matter hypodensities are noted bilaterally. No hydrocephalus. Vascular: No hyperdense vessel or unexpected calcification. Skull: Normal. Negative for fracture or focal lesion. Sinuses/Orbits: Mild mucosal thickening is present in the maxillary sinuses and ethmoid air cells. No acute orbital abnormality. Other: None. IMPRESSION: 1. No acute intracranial process. 2. Atrophy with chronic microvascular ischemic changes. Electronically Signed   By: Thornell SartoriusLaura  Taylor M.D.   On: 12/26/2021 04:59  ? ?DG Chest Portable 1 View ? ?Result Date: 12/26/2021 ?CLINICAL DATA:  Altered mental status EXAM: PORTABLE CHEST 1 VIEW COMPARISON:  06/30/2021 FINDINGS: Low lung volumes. Mild right basilar atelectasis. No pleural effusion or pneumothorax. The heart is normal in size. Cervical spine fixation hardware, IMPRESSION: No evidence of acute cardiopulmonary disease. Electronically Signed   By: Charline BillsSriyesh  Krishnan M.D.   On: 12/26/2021 02:46   ? ? ? ?Radiology ?CT Head Wo Contrast ? ?Result Date: 12/26/2021 ?CLINICAL DATA:  Altered mental status. EXAM: CT HEAD WITHOUT CONTRAST TECHNIQUE: Contiguous axial images were obtained from the base of the skull through the vertex without intravenous contrast. RADIATION DOSE REDUCTION: This exam was performed according to the departmental dose-optimization program which includes automated exposure control, adjustment of the mA and/or kV according to patient size and/or use of iterative reconstruction technique. COMPARISON:   08/18/2017. FINDINGS: Brain: No acute intracranial hemorrhage, midline shift or mass effect. No extra-axial fluid collection. Diffuse atrophy is noted. Periventricular white matter hypodensities are noted bilate

## 2021-12-26 NOTE — Consult Note (Signed)
?Consultation Note ?Date: 12/26/2021  ? ?Patient Name: Cole Harris  ?DOB: [birth year]  MRN: [id number]  Age / Sex: 76 y.o., male  ?PCP: Ria Bush, MD ?Referring Physician: Norval Morton, MD ? ?Reason for Consultation: Establishing goals of care, "goals of care came in for ams workup neg so far  MRI pending" ? ?HPI/Patient Profile: 76 y.o. male  with past medical history of hypertension, hyperlipidemia, pulmonary hypertension, diabetes mellitus type 2, Alzheimer's disease, depression, back pain s/p spinal stimulator and GERD presented to the ED on 12/26/2021 from home with family concerns of AMS -thought he may have a UTI.  Urinalysis, head CT, labs, chest x-ray were all relatively unremarkable.  MRI is pending.  Suspect symptoms are secondary to worsening dementia.  Patient was admitted on 12/26/2021 with AMS, leukocytosis, dehydration.  ? ?Clinical Assessment and Goals of Care: ?I have reviewed medical records including EPIC notes, labs, and imaging. Received report from primary RN -no acute concerns.  RN reports patient is confused and refusing medication. ? ?Went to visit patient at bedside -no family/visitors present. Patient was lying in bed awake, alert, disoriented x4, and is able to participate in simple conversation only. No signs or non-verbal gestures of pain or discomfort noted. No respiratory distress, increased work of breathing, or secretions noted.  Denies pain. ? ?Met with patient's son/Cole and Cole Harris via speaker phone to discuss diagnosis, prognosis, GOC, EOL wishes, disposition, and options. ? ?I introduced Palliative Medicine as specialized medical care for people living with serious illness. It focuses on providing relief from the symptoms and stress of a serious illness. The goal is to improve quality of life for both the patient and the family. ? ?We discussed a brief life review of the  patient as well as functional and nutritional status.  Patient is a English as a second language teacher.  He is married with 3 sons.  Prior to hospitalization, patient was living in a private residence with his wife, son, and daughter-in-law.  Cole Harris states that prior to "a couple of days ago" patient was walking, now he is not.  Patient has not been able to dress or bathe himself for 1 year and is also incontinent.  They do not have any home health services; however, Cole Harris states they have been looking into getting private duty caregiver support as patient has become increasingly functionally dependent.  Cole Harris reports patient appetite at home was good though he is a feeder.  Albumin noted to be 3.9 on 12/03/2021.  Family notes that patient was diagnosed with dementia in 2018.  He also recently has begun hallucinating. ? ?We discussed patient's current illness and what it means in the larger context of patient's on-going co-morbidities. Family understands that dementia is a progressive, non-curable disease underlying the patient's current acute medical conditions. We reviewed specific indicators of end stage dementia, including inability to communicate, bed bound/non-ambulatory status, decreased oral intake, swallowing dysfunction, and incontinence of bowel/bladder. Patient's history is significant for gradually worsening progressive symptoms of dementia, especially considering ED work up has not found medical indications for decline. Natural disease trajectory and expectations at EOL were discussed. I attempted to elicit values and goals of care important to the patient. The difference between aggressive medical intervention and comfort care was considered in light of the patient's goals of care. Family are clear their goal is to not have him rehospitalized but would prefer him stay home and be kept comfortable. ? ?Provided education and counseling at length on the philosophy and benefits of hospice care.  Discussed that it offers a holistic  approach to care in the setting of end-stage illness, and is about supporting the patient where they are allowing nature to take it's course. Discussed the hospice team includes RNs, physicians, social workers, and chaplains. They can provide personal care, support for the family, and help keep patient out of the hospital as well as assist with DME needs for home hospice. Education provided on the difference between home vs residential hospice. Family express interest in hospice - would like to discuss this option with patient's wife which I encouraged.  ? ?Advance directives, concepts specific to code status, artificial feeding and hydration, and rehospitalization were considered and discussed. Patient does not have a Living Will or HCPOA. Patient's son/Cole works closely with patient's wife (Optician, dispensing) on medical decisions for patient.  ? ?Encouraged family to consider DNR/DNI status understanding evidenced based poor outcomes in similar hospitalized patient, as the cause of arrest is likely associated with advanced chronic/terminal illness rather than an easily reversible acute cardio-pulmonary event. I explained that DNR/DNI does not change the medical plan and it only comes into effect after a person has arrested (died).  It is a protective measure to keep [place] from harming the patient in their last moments of life. Family were not agreeable to DNR/DNI with understanding that patient would receive CPR, defibrillation, ACLS medications, and/or intubation. However, they are considering DNR/DNI and Cole Harris and Cole Harris will discuss with patient's wife tonight. ? ?Discussed with family the importance of continued conversation with each other and the medical providers regarding overall plan of care and treatment options, ensuring decisions are within the context of the patient?s values and GOCs.   ? ?Questions and concerns were addressed. The patient/family was encouraged to call with questions and/or  concerns. PMT number was provided. ? ? ?Primary Decision Maker: ?NEXT OF KIN - son/Cole Harris with assistance from patient's wife ?  ? ?SUMMARY OF RECOMMENDATIONS   ?Continue current medical treatment ?Continue full code -family considering DNR/DNI ?Family are discussing initiating hospice - requested time to discuss information given to them today  ?PMT will follow-up tomorrow 4/30 for continued goals of care ?PMT will continue to follow and support holistically ? ? ?Code Status/Advance Care Planning: ?Full code ? ?Palliative Prophylaxis:  ?Aspiration, Bowel Regimen, Delirium Protocol, Frequent Pain Assessment, Oral Care, and Turn Reposition ? ?Additional Recommendations (Limitations, Scope, Preferences): ?Full Scope Treatment ? ?Psycho-social/Spiritual:  ?Created space and opportunity for patient and family to express thoughts and feelings regarding patient's current medical situation.  ?Emotional support and therapeutic listening provided. ? ?Prognosis:  ?< 6 months would not be surprising if patient continues to decline and goal is not to re-hospitalize ? ?Discharge Planning: To Be Determined  ? ?  ? ?Primary Diagnoses: ?Present on Admission: ? Altered mental status ? GERD (gastroesophageal reflux disease) ? Alzheimer's disease with early onset [facility]) ? Leukocytosis ? Dehydration ? Depression ? ? ?I have reviewed the medical record, interviewed the patient and family, and examined the patient. The following aspects are pertinent. ? ?Past Medical History:  ?Diagnosis Date  ? Alzheimer's disease with early onset ([place]) 08/24/2017  ? Arthritis   ? CAD (coronary artery disease) 08/24/2017  ? Depression   ? Diabetes mellitus without complication ([place])   ? GERD (gastroesophageal reflux disease)   ? Hyperlipidemia   ? Hypertension   ? Pulmonary hypertension ([place]) 08/24/2017  ? Vertebrobasilar insufficiency 08/24/2017  ? ?Social History  ? ?Socioeconomic History  ? Marital status: Married  ?  Spouse name: Not on file  ?  Number of children: Not on file  ? Years of education: Not on file  ? Highest education level: Not on file  ?Occupational History  ? Not on file  ?Tobacco Use  ? Smoking status: Former  ? Smokeless tobacco: N

## 2021-12-27 ENCOUNTER — Observation Stay (HOSPITAL_COMMUNITY): Payer: Medicare Other

## 2021-12-27 DIAGNOSIS — I10 Essential (primary) hypertension: Secondary | ICD-10-CM | POA: Diagnosis present

## 2021-12-27 DIAGNOSIS — Z515 Encounter for palliative care: Secondary | ICD-10-CM | POA: Diagnosis not present

## 2021-12-27 DIAGNOSIS — R262 Difficulty in walking, not elsewhere classified: Secondary | ICD-10-CM | POA: Diagnosis present

## 2021-12-27 DIAGNOSIS — G4733 Obstructive sleep apnea (adult) (pediatric): Secondary | ICD-10-CM | POA: Diagnosis present

## 2021-12-27 DIAGNOSIS — E785 Hyperlipidemia, unspecified: Secondary | ICD-10-CM | POA: Diagnosis present

## 2021-12-27 DIAGNOSIS — F0283 Dementia in other diseases classified elsewhere, unspecified severity, with mood disturbance: Secondary | ICD-10-CM | POA: Diagnosis present

## 2021-12-27 DIAGNOSIS — F0282 Dementia in other diseases classified elsewhere, unspecified severity, with psychotic disturbance: Secondary | ICD-10-CM | POA: Diagnosis present

## 2021-12-27 DIAGNOSIS — Z7984 Long term (current) use of oral hypoglycemic drugs: Secondary | ICD-10-CM | POA: Diagnosis not present

## 2021-12-27 DIAGNOSIS — R4182 Altered mental status, unspecified: Secondary | ICD-10-CM | POA: Diagnosis not present

## 2021-12-27 DIAGNOSIS — Z66 Do not resuscitate: Secondary | ICD-10-CM | POA: Diagnosis present

## 2021-12-27 DIAGNOSIS — F32A Depression, unspecified: Secondary | ICD-10-CM | POA: Diagnosis present

## 2021-12-27 DIAGNOSIS — G9341 Metabolic encephalopathy: Secondary | ICD-10-CM | POA: Diagnosis present

## 2021-12-27 DIAGNOSIS — E860 Dehydration: Secondary | ICD-10-CM | POA: Diagnosis present

## 2021-12-27 DIAGNOSIS — Z83438 Family history of other disorder of lipoprotein metabolism and other lipidemia: Secondary | ICD-10-CM | POA: Diagnosis not present

## 2021-12-27 DIAGNOSIS — E1165 Type 2 diabetes mellitus with hyperglycemia: Secondary | ICD-10-CM | POA: Diagnosis present

## 2021-12-27 DIAGNOSIS — Z6839 Body mass index (BMI) 39.0-39.9, adult: Secondary | ICD-10-CM | POA: Diagnosis not present

## 2021-12-27 DIAGNOSIS — G253 Myoclonus: Secondary | ICD-10-CM | POA: Diagnosis present

## 2021-12-27 DIAGNOSIS — E669 Obesity, unspecified: Secondary | ICD-10-CM | POA: Diagnosis present

## 2021-12-27 DIAGNOSIS — Z87891 Personal history of nicotine dependence: Secondary | ICD-10-CM | POA: Diagnosis not present

## 2021-12-27 DIAGNOSIS — I272 Pulmonary hypertension, unspecified: Secondary | ICD-10-CM | POA: Diagnosis present

## 2021-12-27 DIAGNOSIS — I2510 Atherosclerotic heart disease of native coronary artery without angina pectoris: Secondary | ICD-10-CM | POA: Diagnosis present

## 2021-12-27 DIAGNOSIS — G9340 Encephalopathy, unspecified: Secondary | ICD-10-CM | POA: Diagnosis present

## 2021-12-27 DIAGNOSIS — G300 Alzheimer's disease with early onset: Secondary | ICD-10-CM | POA: Diagnosis present

## 2021-12-27 DIAGNOSIS — D72829 Elevated white blood cell count, unspecified: Secondary | ICD-10-CM | POA: Diagnosis present

## 2021-12-27 DIAGNOSIS — K219 Gastro-esophageal reflux disease without esophagitis: Secondary | ICD-10-CM | POA: Diagnosis present

## 2021-12-27 LAB — CBC
HCT: 41 % (ref 39.0–52.0)
Hemoglobin: 14.1 g/dL (ref 13.0–17.0)
MCH: 32.6 pg (ref 26.0–34.0)
MCHC: 34.4 g/dL (ref 30.0–36.0)
MCV: 94.7 fL (ref 80.0–100.0)
Platelets: 208 10*3/uL (ref 150–400)
RBC: 4.33 MIL/uL (ref 4.22–5.81)
RDW: 12.5 % (ref 11.5–15.5)
WBC: 7.9 10*3/uL (ref 4.0–10.5)
nRBC: 0 % (ref 0.0–0.2)

## 2021-12-27 LAB — BASIC METABOLIC PANEL
Anion gap: 8 (ref 5–15)
BUN: 8 mg/dL (ref 8–23)
CO2: 26 mmol/L (ref 22–32)
Calcium: 9.3 mg/dL (ref 8.9–10.3)
Chloride: 102 mmol/L (ref 98–111)
Creatinine, Ser: 0.71 mg/dL (ref 0.61–1.24)
GFR, Estimated: >60 mL/min (ref >60)
Glucose, Bld: 218 mg/dL — ABNORMAL HIGH (ref 70–99)
Potassium: 3.9 mmol/L (ref 3.5–5.1)
Sodium: 136 mmol/L (ref 135–145)

## 2021-12-27 LAB — GLUCOSE, CAPILLARY
Glucose-Capillary: 186 mg/dL — ABNORMAL HIGH (ref 70–99)
Glucose-Capillary: 139 mg/dL — ABNORMAL HIGH (ref 70–99)
Glucose-Capillary: 144 mg/dL — ABNORMAL HIGH (ref 70–99)
Glucose-Capillary: 136 mg/dL — ABNORMAL HIGH (ref 70–99)

## 2021-12-27 MED ORDER — VALPROATE SODIUM 100 MG/ML IV SOLN
125.0000 mg | Freq: Three times a day (TID) | INTRAVENOUS | Status: DC
  Administered 2021-12-27 – 2021-12-28 (×4): 125 mg via INTRAVENOUS
  Filled 2021-12-27 (×6): qty 1.25

## 2021-12-27 NOTE — Progress Notes (Signed)
EEG complete - results pending 

## 2021-12-27 NOTE — Evaluation (Signed)
Clinical/Bedside Swallow Evaluation ?Patient Details  ?Name: Cole Harris ?MRN: [id number] ?Date of Birth: 06-Mar-1946 ? ?Today's Date: 12/27/2021 ?Time: SLP Start Time (ACUTE ONLY): 1400 SLP Stop Time (ACUTE ONLY): 1412 ?SLP Time Calculation (min) (ACUTE ONLY): 12 min ? ?Past Medical History:  ?Past Medical History:  ?Diagnosis Date  ? Alzheimer's disease with early onset ([facility]) 08/24/2017  ? Arthritis   ? CAD (coronary artery disease) 08/24/2017  ? Depression   ? Diabetes mellitus without complication ([facility])   ? GERD (gastroesophageal reflux disease)   ? Hyperlipidemia   ? Hypertension   ? Pulmonary hypertension ([facility]) 08/24/2017  ? Vertebrobasilar insufficiency 08/24/2017  ? ?Past Surgical History:  ?Past Surgical History:  ?Procedure Laterality Date  ? BACK SURGERY  1998  ? INTERNAL TENS UNIT  2002  ? TONSILLECTOMY  1955  ? ?HPI:  ?Pt is a 76 y.o. male who presented to the ED with c/o "not feeling well" for some days, and reports of increasing sleeping, confusion, and refusal to ambulate.  PMT consulted; continue full code, but family considering DNR/DNI. CXR on admission clear, UA and CT head negative. PMT: goal is to continue gentle medical treatment with watchful waiting; full comfort 5/1 if no improvement. PMH: hypertension, hyperlipidemia, pulmonary hypertension, diabetes mellitus type 2, Alzheimer's disease, depression, back pain s/p spinal stimulator and GERD.  ?  ?Assessment / Plan / Recommendation  ?Clinical Impression ? Pt was seen for bedside swallow evaluation with his wife present. Pt's wife reported that the pt's mentation is improved compared to yesterday. She stated that the pt typically eats regular texture solids and thin liquids despite his edentulous status, and has not been having difficulty with meals today. Oral mechanism exam was [facility] and he was edentulous. He tolerated all solids and liquids without signs or symptoms of oropharyngeal dysphagia. Pt's diet will be advanced to regular texture  solids and thin liquids. SLP will follow briefly to ensure tolerance. ?SLP Visit Diagnosis: Dysphagia, unspecified (R13.10) ?   ?Aspiration Risk ? Mild aspiration risk  ?  ?Diet Recommendation Regular;Thin liquid  ? ?Liquid Administration via: Cup;Straw ?Medication Administration: Crushed with puree ?Supervision: Patient able to self feed ?Compensations: Minimize environmental distractions ?Postural Changes: Seated upright at 90 degrees  ?  ?Other  Recommendations Oral Care Recommendations: Oral care BID   ? ?Recommendations for follow up therapy are one component of a multi-disciplinary discharge planning process, led by the attending physician.  Recommendations may be updated based on patient status, additional functional criteria and insurance authorization. ? ?Follow up Recommendations No SLP follow up  ? ? ?  ?Assistance Recommended at Discharge None  ?Functional Status Assessment Patient has had a recent decline in their functional status and demonstrates the ability to make significant improvements in function in a reasonable and predictable amount of time.  ?Frequency and Duration min 1 x/week  ?1 week ?  ?   ? ?Prognosis Prognosis for Safe Diet Advancement: Good ?Barriers to Reach Goals: Cognitive deficits  ? ?  ? ?Swallow Study   ?General Date of Onset: 12/26/21 ?HPI: Pt is a 76 y.o. male who presented to the ED with c/o "not feeling well" for some days, and reports of increasing sleeping, confusion, and refusal to ambulate.  PMT consulted; continue full code, but family considering DNR/DNI. CXR on admission clear, UA and CT head negative. PMT: goal is to continue gentle medical treatment with watchful waiting; full comfort 5/1 if no improvement. PMH: hypertension, hyperlipidemia, pulmonary hypertension, diabetes mellitus type 2, Alzheimer's  disease, depression, back pain s/p spinal stimulator and GERD. ?Type of Study: Bedside Swallow Evaluation ?Previous Swallow Assessment: none ?Diet Prior to this Study:  Dysphagia 3 (soft);Thin liquids ?Temperature Spikes Noted: No ?Respiratory Status: Room air ?History of Recent Intubation: No ?Behavior/Cognition: Alert;Cooperative;Pleasant mood;Confused;Requires cueing ?Oral Cavity Assessment: Within Functional Limits ?Oral Care Completed by SLP: No ?Oral Cavity - Dentition: Edentulous ?Vision: Functional for self-feeding ?Self-Feeding Abilities: Able to feed self ?Patient Positioning: Upright in bed;Postural control adequate for testing ?Baseline Vocal Quality: Normal ?Volitional Cough: Cognitively unable to elicit ?Volitional Swallow: Unable to elicit  ?  ?Oral/Motor/Sensory Function Overall Oral Motor/Sensory Function: Within functional limits   ?Ice Chips Ice chips: Not tested   ?Thin Liquid Thin Liquid: Within functional limits ?Presentation: Straw  ?  ?Nectar Thick Nectar Thick Liquid: Not tested   ?Honey Thick Honey Thick Liquid: Not tested   ?Puree Puree: Within functional limits ?Presentation: Spoon   ?Solid ? ? ?  Solid: Within functional limits ?Presentation: Self Fed  ? ?  ?Devorah Givhan I. Vear Clock, MS, CCC-SLP ?Acute Rehabilitation Services ?Office number [phone] ?Pager [phone] ? ?Scheryl Marten ?12/27/2021,2:17 PM ? ? ? ? ? ?

## 2021-12-27 NOTE — Evaluation (Signed)
Occupational Therapy Evaluation ?Patient Details ?Name: Cole Harris ?MRN: [id number] ?DOB: 05-06-1946 ?Today's Date: 12/27/2021 ? ? ?History of Present Illness 76 y.o. male who presents to ED 12/26/21 with with a couple days of not feeling well. Urinalysis did not show any signs of infection.  Chest x-ray was otherwise noted to be clear and CT scan of the head did not note any acute abnormality. MRI brain pending.  PMH includes: alzheimers disease, CAD, depression, DM, HTN, back pain s/p spinal stimulator .  ? ?Clinical Impression ?  ?Pt admitted for concerns listed above. PTA pt's family reported that he was able to ambulate on his own, and complete most dressing tasks. Recently he requires increased assist with bathing. At this time, pt is not following any simple commands, even with max encouragement from PT/OT and wife. Pt required total A +2 for bed mobility, as he was resistive and no providing any assist. Family reports that they have to provide a lot of coaxing/encouragement to participate in tasks and that he is not far from his baseline with cognition. OT will continue to follow acutely.   ?   ? ?Recommendations for follow up therapy are one component of a multi-disciplinary discharge planning process, led by the attending physician.  Recommendations may be updated based on patient status, additional functional criteria and insurance authorization.  ? ?Follow Up Recommendations ? No OT follow up  ?  ?Assistance Recommended at Discharge Frequent or constant Supervision/Assistance  ?Patient can return home with the following Two people to help with walking and/or transfers;Two people to help with bathing/dressing/bathroom;Assistance with feeding;Assist for transportation;Help with stairs or ramp for entrance ? ?  ?Functional Status Assessment ? Patient has had a recent decline in their functional status and/or demonstrates limited ability to make significant improvements in function in a reasonable and  predictable amount of time  ?Equipment Recommendations ? None recommended by OT  ?  ?Recommendations for Other Services   ? ? ?  ?Precautions / Restrictions Precautions ?Precautions: Fall ?Restrictions ?Weight Bearing Restrictions: No  ? ?  ? ?Mobility Bed Mobility ?Overal bed mobility: Needs Assistance ?Bed Mobility: Supine to Sit, Sit to Supine ?  ?  ?Supine to sit: Total assist, +2 for physical assistance, +2 for safety/equipment ?Sit to supine: Total assist, +2 for physical assistance, +2 for safety/equipment ?  ?General bed mobility comments: Pt not following commands at this time, resistive, requiring total A +2 to sit and return to supine.  Most likely c an assist more ?  ? ?Transfers ?Overall transfer level: Needs assistance ?  ?Transfers: Sit to/from Stand ?  ?  ?  ?  ?  ?  ?General transfer comment: Attempted sit to stand, both hand held and with RW, pt not following commands to stand, Would have required total +2 to clear his bottom from bed due to pt not attempting to help at all. ?  ? ?  ?Balance   ?  ?  ?  ?  ?  ?  ?  ?  ?  ?  ?  ?  ?  ?  ?  ?  ?  ?  ?   ? ?ADL either performed or assessed with clinical judgement  ? ?ADL Overall ADL's : Needs assistance/impaired ?  ?  ?  ?  ?  ?  ?  ?  ?  ?  ?  ?  ?  ?  ?  ?  ?  ?  ?  ?General ADL  Comments: At this time pt is not following any commands, unable to assess ability to complete ADL's. Most likely he can assist some with ADL's, "when he feels like it"  ? ? ? ?Vision Baseline Vision/History: 1 Wears glasses ?Ability to See in Adequate Light: 0 Adequate ?Patient Visual Report: No change from baseline ?Vision Assessment?: No apparent visual deficits  ?   ?Perception   ?  ?Praxis   ?  ? ?Pertinent Vitals/Pain Pain Assessment ?Pain Assessment: No/denies pain  ? ? ? ?Hand Dominance Right ?  ?Extremity/Trunk Assessment Upper Extremity Assessment ?Upper Extremity Assessment: Overall WFL for tasks assessed ?  ?Lower Extremity Assessment ?Lower Extremity Assessment:  Defer to PT evaluation ?  ?Cervical / Trunk Assessment ?Cervical / Trunk Assessment: Kyphotic ?  ?Communication Communication ?Communication: Expressive difficulties ?  ?Cognition Arousal/Alertness: Awake/alert ?Behavior During Therapy: [facility] for tasks assessed/performed ?Overall Cognitive Status: History of cognitive impairments - at baseline ?  ?  ?  ?  ?  ?  ?  ?  ?  ?  ?  ?  ?  ?  ?  ?  ?General Comments: Baseline dementia - wife states that pt is ornery and they have to coax him into everything ?  ?  ?General Comments  BP low - 94/74 supine and 96/66 sitting EOB ? ?  ?Exercises   ?  ?Shoulder Instructions    ? ? ?Home Living Family/patient expects to be discharged to:: Private residence ?Living Arrangements: Spouse/significant other;Children ?Available Help at Discharge: Family;Available 24 hours/day ?Type of Home: House ?Home Access: Stairs to enter ?Entrance Stairs-Number of Steps: 6 steps ?Entrance Stairs-Rails: Can reach both ?Home Layout: Two level;Able to live on main level with bedroom/bathroom ?  ?  ?Bathroom Shower/Tub: Walk-in shower ?  ?Bathroom Toilet: Standard ?  ?  ?Home Equipment: Agricultural consultant (2 wheels);Shower seat - built in ?  ?  ?  ? ?  ?Prior Functioning/Environment Prior Level of Function : Needs assist ?  ?  ?  ?  ?  ?  ?Mobility Comments: patient can normally ambulate without AD in the home ?ADLs Comments: requires some assist for bathing, dressing at baseline ?  ? ?  ?  ?OT Problem List: Decreased strength;Decreased activity tolerance;Impaired balance (sitting and/or standing);Decreased cognition;Decreased safety awareness;Decreased knowledge of use of DME or AE;Obesity ?  ?   ?OT Treatment/Interventions: Self-care/ADL training;Therapeutic exercise;Energy conservation;DME and/or AE instruction;Therapeutic activities;Patient/family education;Balance training;Cognitive remediation/compensation  ?  ?OT Goals(Current goals can be found in the care plan section) Acute Rehab OT Goals ?Patient  Stated Goal: To go home ?OT Goal Formulation: With patient/family ?Time For Goal Achievement: 01/10/22 ?Potential to Achieve Goals: Fair ?ADL Goals ?Pt Will Transfer to Toilet: with min assist;ambulating ?Additional ADL Goal #1: Pt will complete all aspects of bed mobility with min A, as precursor to seated ADL's. ?Additional ADL Goal #2: Pt will follow 1 step commands 75% of session.  ?OT Frequency: Min 2X/week ?  ? ?Co-evaluation PT/OT/SLP Co-Evaluation/Treatment: Yes ?Reason for Co-Treatment: Necessary to address cognition/behavior during functional activity;For patient/therapist safety;To address functional/ADL transfers ?  ?OT goals addressed during session: Other (comment) (bed mobility and transfers) ?  ? ?  ?AM-PAC OT "6 Clicks" Daily Activity     ?Outcome Measure Help from another person eating meals?: Total ?Help from another person taking care of personal grooming?: Total ?Help from another person toileting, which includes using toliet, bedpan, or urinal?: Total ?Help from another person bathing (including washing, rinsing, drying)?: Total ?Help from another  person to put on and taking off regular upper body clothing?: Total ?Help from another person to put on and taking off regular lower body clothing?: Total ?6 Click Score: 6 ?  ?End of Session Nurse Communication: Mobility status ? ?Activity Tolerance: Other (comment) (Pt resistive and not following commands) ?Patient left: in bed;with call bell/phone within reach;with bed alarm set;with family/visitor present ? ?OT Visit Diagnosis: Unsteadiness on feet (R26.81);Other abnormalities of gait and mobility (R26.89);Muscle weakness (generalized) (M62.81)  ?              ?Time: [id number] ?OT Time Calculation (min): 27 min ?Charges:  OT General Charges ?$OT Visit: 1 Visit ?OT Evaluation ?$OT Eval Moderate Complexity: 1 Mod ? ?Josh Nicolosi H., OTR/L ?Acute Rehabilitation ? ?Lyric Hoar Elane Bing PlumeHaynes ?12/27/2021, 1:39 PM ?

## 2021-12-27 NOTE — Progress Notes (Signed)
?                                  PROGRESS NOTE                                             ?                                                                                                                     ?                                         ? ? Patient Demographics:  ? ? Cole Harris, is a 76 y.o. male, DOB - 1945/10/02, SAY:[id number] ? ?Outpatient Primary MD for the patient is Eustaquio Boyden, MD    LOS - 0  Admit date - 12/26/2021   ? ?Chief Complaint  ?Patient presents with  ? Altered Mental Status  ?  Per pt family, believes that pt has an urinary tract infection. Pt is on hospice   ?    ? ?Brief Narrative (HPI from H&P)  76 y.o. male with medical history significant of hypertension, hyperlipidemia, pulmonary hypertension, diabetes mellitus type 2, Alzheimer's disease, depression, back pain s/p spinal stimulator and GERD who presents with with a couple days of not feeling well. ? ?Patient was diagnosed with gradually progressive dementia several years ago and since then he has been gradually declining in terms of quality of life and mental status, in the ER he was found obtunded, head CT was nonacute, family now wishes gentle medical treatment, DNR and if declines full comfort measures. ? ? Subjective:  ? ? [name] today in bed, obtunded unable to answer questions or follow commands.  Wearing BiPAP but in no distress. ? ? Assessment  & Plan :  ? ?AMS due to gradually progressive Alzheimer's dementia worsened by metabolic encephalopathy due to dehydration.  This most likely is gradually progressive Alzheimer's dementia with mild worsening due to dehydration, patient is now DNR, had detailed discussion with his wife bedside on 12/27/2021, we will do gentle medical treatment if further decline then full comfort measures.  He is DNR.   ? ?Continue hydration with IV fluids, head CT initially was negative, since he has spinal stimulator will repeat head CT on  12/27/2021 along with EEG due to his myoclonic jerking movements.  He is on valproic acid with low levels we will request pharmacy to dose IV as he is n.p.o. and get levels improved.  At any rate he is not a candidate for heroic interventions we will continue supportive care with goal directed towards comfort. ? ? ?2.  Dehydration.  Hydrate with IV fluids.  Reactionary leukocytosis improving. ? ?  3.  Alzheimer's dementia.  Plan as above.  Continue memantine and donepezil.  Also on risperidone.  Depakote as #1 above. ? ?4.  History of depression.  Continue Wellbutrin and Lexapro if he can tolerate ? ?5.  GERD.  On PPI ? ?6.  Dyslipidemia.  On Crestor. ? ?7.  OSA.  Nighttime CPAP.  If mentation does not improve will discontinue CPAP as it may cause aspiration, as it is ABG was stable when he was noncompliant with CPAP at night. ? ?8.  DM type II.  ISS. ? ?Lab Results  ?Component Value Date  ? HGBA1C 8.1 (H) 12/03/2021  ? ?CBG (last 3)  ?Recent Labs  ?  12/26/21 ?1700 12/26/21 ?2308 12/27/21 ?0750  ?GLUCAP 135* 285* 186*  ? ? ?   ? ?Condition - Extremely Guarded ? ?Family Communication  : Wife bedside on 12/27/2021 ? ?Code Status :  DNR ? ?Consults  :  Pall. Care ? ?PUD Prophylaxis :  ? ? Procedures  :    ? ?CT head 12/26/21 -  nonacute. ? ?EEG -  ? ?Repeat CT head 12/27/21 -  ? ?   ? ?Disposition Plan  :   ? ?Status is: Observation ? ? ?DVT Prophylaxis  :   ? ?Lab Results  ?Component Value Date  ? PLT 208 12/27/2021  ? ? ?Diet :  ?Diet Order   ? ?       ?  DIET SOFT Room service appropriate? Yes; Fluid consistency: Thin  Diet effective now       ?  ? ?  ?  ? ?  ?  ? ?Inpatient Medications ? ?Scheduled Meds: ? buPROPion  75 mg Oral BID  ? divalproex  125 mg Oral Q8H  ? donepezil  10 mg Oral Daily  ? enoxaparin (LOVENOX) injection  0.5 mg/kg Subcutaneous Q24H  ? escitalopram  20 mg Oral Daily  ? insulin aspart  0-15 Units Subcutaneous TID WC  ? memantine  10 mg Oral BID  ? pantoprazole  40 mg Oral Daily  ? risperiDONE  0.25  mg Oral QHS  ? rosuvastatin  20 mg Oral Daily  ? sodium chloride flush  3 mL Intravenous Q12H  ? ?Continuous Infusions: ? sodium chloride 75 mL/hr at 12/26/21 2248  ? ?PRN Meds:.acetaminophen **OR** acetaminophen, albuterol, ondansetron **OR** ondansetron (ZOFRAN) IV ? ?Antibiotics  :   ? ?Anti-infectives (From admission, onward)  ? ? None  ? ?  ? ? ? Time Spent in minutes  30 ? ? ?Susa RaringPrashant Catrina Fellenz M.D on 12/27/2021 at 10:45 AM ? ?To page go to [email or web address]  ? ?[facility] -  Office  [phone] ? ?See all Orders from today for further details ? ? ? Objective:  ? ?Vitals:  ? 12/26/21 2309 12/27/21 0000 12/27/21 0401 12/27/21 0749  ?BP: (!) 140/94 (!) 135/92 119/63 123/64  ?Pulse: 69 73 68 60  ?Resp: 16 20 17 15   ?Temp:  98.5 ?F (36.9 ?C) 98 ?F (36.7 ?C) 98.4 ?F (36.9 ?C)  ?TempSrc:  Axillary Axillary Oral  ?SpO2: 96% 92% 91% 92%  ?Weight:      ?Height:      ? ? ?Wt Readings from Last 3 Encounters:  ?12/26/21 113 kg  ?12/02/21 113.2 kg  ?07/03/21 121.5 kg  ? ? ? ?Intake/Output Summary (Last 24 hours) at 12/27/2021 1045 ?Last data filed at 12/27/2021 0751 ?Gross per 24 hour  ?Intake 908.28 ml  ?Output 275 ml  ?Net 633.28 ml  ? ? ? ?  Physical Exam ? ?Somnolent but in no distress, having intermittent myoclonic jerking movements ?[place].AT,PERRAL ?Supple Neck, No JVD,   ?Symmetrical Chest wall movement, Good air movement bilaterally, CTAB ?RRR,No Gallops,Rubs or new Murmurs,  ?+ve B.Sounds, Abd Soft, No tenderness,   ?No Cyanosis, Clubbing or edema  ?  ? ?RN pressure injury documentation: ?  ? ? Data Review:  ? ? ?CBC ?Recent Labs  ?Lab 12/26/21 ?0149 12/26/21 ?0225 12/26/21 ?1540 12/27/21 ?0867  ?WBC 10.6*  --   --  7.9  ?HGB 14.3 14.3 13.9 14.1  ?HCT 42.1 42.0 41.0 41.0  ?PLT 195  --   --  208  ?MCV 95.9  --   --  94.7  ?MCH 32.6  --   --  32.6  ?MCHC 34.0  --   --  34.4  ?RDW 12.8  --   --  12.5  ?LYMPHSABS 3.3  --   --   --   ?MONOABS 1.0  --   --   --   ?EOSABS 0.2  --   --   --   ?BASOSABS 0.1  --   --   --    ? ? ?Electrolytes ?Recent Labs  ?Lab 12/26/21 ?0225 12/26/21 ?6195 12/26/21 ?1800 12/27/21 ?0932  ?NA 137 137  --  136  ?K 3.9 3.6  --  3.9  ?CL 100  --   --  102  ?CO2  --   --   --  26  ?GLUCOSE 163*  --   --  218*  ?BUN 17  --   --  8  ?CREATININE 0.80  --   --  0.71  ?CALCIUM  --   --   --  9.3  ?TSH  --   --  1.501  --   ?AMMONIA  --   --  67*  --   ? ? ?------------------------------------------------------------------------------------------------------------------ ?No results for input(s): CHOL, HDL, LDLCALC, TRIG, CHOLHDL, LDLDIRECT in the last 72 hours. ? ?Lab Results  ?Component Value Date  ? HGBA1C 8.1 (H) 12/03/2021  ? ? ?Recent Labs  ?  12/26/21 ?1800  ?TSH 1.501  ? ?------------------------------------------------------------------------------------------------------------------ ?ID Labs ?Recent Labs  ?Lab 12/26/21 ?0149 12/26/21 ?0225 12/27/21 ?6712  ?WBC 10.6*  --  7.9  ?PLT 195  --  208  ?CREATININE  --  0.80 0.71  ? ?Cardiac Enzymes ?No results for input(s): CKMB, TROPONINI, MYOGLOBIN in the last 168 hours. ? ?Invalid input(s): CK ? ? ?Micro Results ?No results found for this or any previous visit (from the past 240 hour(s)). ? ?Radiology Reports ?CT Head Wo Contrast ? ?Result Date: 12/26/2021 ?CLINICAL DATA:  Altered mental status. EXAM: CT HEAD WITHOUT CONTRAST TECHNIQUE: Contiguous axial images were obtained from the base of the skull through the vertex without intravenous contrast. RADIATION DOSE REDUCTION: This exam was performed according to the departmental dose-optimization program which includes automated exposure control, adjustment of the mA and/or kV according to patient size and/or use of iterative reconstruction technique. COMPARISON:  08/18/2017. FINDINGS: Brain: No acute intracranial hemorrhage, midline shift or mass effect. No extra-axial fluid collection. Diffuse atrophy is noted. Periventricular white matter hypodensities are noted bilaterally. No hydrocephalus. Vascular: No  hyperdense vessel or unexpected calcification. Skull: Normal. Negative for fracture or focal lesion. Sinuses/Orbits: Mild mucosal thickening is present in the maxillary sinuses and ethmoid air cells. No acute orbital a

## 2021-12-27 NOTE — Progress Notes (Signed)
PT Cancellation Note ? ?Patient Details ?Name: ESTABAN MAINVILLE ?MRN: [id number] ?DOB: [birth year] ? ? ?Cancelled Treatment:    Reason Eval/Treat Not Completed: Patient not medically ready ? ?Patient still sleeping with CPAP in place. Wife present and reports he usually sleeps until 11:00 a.m. but may sleep longer due to having to be cleaned up at 1:30 a.m. this morning. Will plan to see in p.m. when more alert.  ? ? ?Jerolyn Center, PT ?Acute Rehabilitation Services  ?Pager [phone] ?Office [phone] ? ? ?Scherrie November Danika Kluender ?12/27/2021, 9:13 AM ?

## 2021-12-27 NOTE — Procedures (Signed)
Routine EEG Report ? ?Cole Harris is a 76 y.o. male with a history of encephalopathy who is undergoing an EEG to evaluate for seizures. ? ?Report: This EEG was acquired with electrodes placed according to the International 10-20 electrode system (including Fp1, Fp2, F3, F4, C3, C4, P3, P4, O1, O2, T3, T4, T5, T6, A1, A2, Fz, Cz, Pz). The following electrodes were missing or displaced: none. ? ?The occipital dominant rhythm was 4-6 Hz. This activity is reactive to stimulation. Drowsiness was manifested by background fragmentation; deeper stages of sleep were not identified. There was no focal slowing. There were no interictal epileptiform discharges. There were no electrographic seizures identified. Photic stimulation and hyperventilation were not performed.  ? ?Impression and clinical correlation: This EEG was obtained while awake and drowsy and is abnormal due to moderate diffuse slowing indicative of global cerebral dysfunction. Epileptiform abnormalities were not seen during this recording. ? ?Bing Neighbors, MD ?[facility] ?[phone] ? ?If 7pm- 7am, please page neurology on call as listed in AMION.  ?

## 2021-12-27 NOTE — Evaluation (Signed)
Physical Therapy Evaluation ?Patient Details ?Name: Cole Harris ?MRN: [id number] ?DOB: 03/04/1946 ?Today's Date: 12/27/2021 ? ?History of Present Illness ? 76 y.o. male who presents to ED 12/26/21 with with a couple days of not feeling well. Urinalysis did not show any signs of infection.  Chest x-ray was otherwise noted to be clear and CT scan of the head did not note any acute abnormality. MRI brain pending.  PMH includes: alzheimers disease, CAD, depression, DM, HTN, back pain s/p spinal stimulator .  ?Clinical Impression ?  ?Pt admitted secondary to problem above with deficits below. PTA patient was living at home with wife and son. He was requiring quite a bit of coaxing to participate in mobility tasks, however once up on his feet he could ambulate unassisted.  Pt currently requires +2 total to come to sit on EOB and would not attempt transfer/standing despite multiple techniques used (including wife assisting). Anticipate pt will be able to stand and walk, however will likely need min guard to min assist at least initially, The difficult thing will be to "catch him" when he wants to mobilize. Have asked nursing to try to get pt up if he is showing signs of trying to get up OOB.  Anticipate patient might benefit from PT to address problems listed below. Will continue to follow acutely to maximize functional mobility independence and safety.   ?   ?   ? ?Recommendations for follow up therapy are one component of a multi-disciplinary discharge planning process, led by the attending physician.  Recommendations may be updated based on patient status, additional functional criteria and insurance authorization. ? ?Follow Up Recommendations No PT follow up ? ?  ?Assistance Recommended at Discharge Frequent or constant Supervision/Assistance  ?Patient can return home with the following ? Two people to help with walking and/or transfers;A lot of help with bathing/dressing/bathroom;Assistance with  cooking/housework;Direct supervision/assist for medications management;Direct supervision/assist for financial management;Assist for transportation;Help with stairs or ramp for entrance ? ?  ?Equipment Recommendations None recommended by PT  ?Recommendations for Other Services ?    ?  ?Functional Status Assessment Patient has had a recent decline in their functional status and demonstrates the ability to make significant improvements in function in a reasonable and predictable amount of time.  ? ?  ?Precautions / Restrictions Precautions ?Precautions: Fall ?Restrictions ?Weight Bearing Restrictions: No  ? ?  ? ?Mobility ? Bed Mobility ?Overal bed mobility: Needs Assistance ?Bed Mobility: Supine to Sit, Sit to Supine ?  ?  ?Supine to sit: Total assist, +2 for physical assistance, +2 for safety/equipment ?Sit to supine: Total assist, +2 for physical assistance, +2 for safety/equipment ?  ?General bed mobility comments: Pt not following commands at this time, resistive, requiring total A +2 to sit and return to supine.  Most likely c an assist more ?  ? ?Transfers ?Overall transfer level: Needs assistance ?  ?Transfers: Sit to/from Stand ?  ?  ?  ?  ?  ?  ?General transfer comment: Attempted sit to stand, both hand held and with RW, pt not following commands to stand, Would have required total +2 to clear his bottom from bed due to pt not attempting to help at all. ?  ? ?Ambulation/Gait ?  ?  ?  ?  ?  ?  ?  ?General Gait Details: unable to stand or walk ? ?Stairs ?  ?  ?  ?  ?  ? ?Wheelchair Mobility ?  ? ?Modified Rankin (Stroke Patients Only) ?  ? ?  ? ?  Balance Overall balance assessment: Needs assistance ?Sitting-balance support: No upper extremity supported, Feet supported ?Sitting balance-Leahy Scale: Fair ?  ?  ?  ?  ?  ?  ?  ?  ?  ?  ?  ?  ?  ?  ?  ?  ?   ? ? ? ?Pertinent Vitals/Pain Pain Assessment ?Pain Assessment: Faces ?Faces Pain Scale: No hurt  ? ? ?Home Living Family/patient expects to be discharged to::  Private residence ?Living Arrangements: Spouse/significant other;Children ?Available Help at Discharge: Family;Available 24 hours/day ?Type of Home: House ?Home Access: Stairs to enter ?Entrance Stairs-Rails: Can reach both ?Entrance Stairs-Number of Steps: 6 steps ?  ?Home Layout: Two level;Able to live on main level with bedroom/bathroom ?Home Equipment: Agricultural consultant (2 wheels);Shower seat - built in ?Additional Comments: son works from home  ?  ?Prior Function Prior Level of Function : Needs assist ? Cognitive Assist : Mobility (cognitive) ?Mobility (Cognitive): Step by step cues ?  ?Physical Assist : ADLs (physical);Mobility (physical) ?Mobility (physical): Bed mobility;Transfers;Stairs ?  ?Mobility Comments: patient can normally ambulate without AD in the home; wife reports having to coax him to get up to sitting on EOB and often has to pull him up. Often has to coax him to stand, but once up he can walk by himself. ?ADLs Comments: requires some assist for bathing, dressing at baseline ?  ? ? ?Hand Dominance  ? Dominant Hand: Right ? ?  ?Extremity/Trunk Assessment  ? Upper Extremity Assessment ?Upper Extremity Assessment: Defer to OT evaluation ?  ? ?Lower Extremity Assessment ?Lower Extremity Assessment: Overall WFL for tasks assessed ?  ? ?Cervical / Trunk Assessment ?Cervical / Trunk Assessment: Kyphotic  ?Communication  ? Communication: Expressive difficulties  ?Cognition Arousal/Alertness: Awake/alert ?Behavior During Therapy: [facility] for tasks assessed/performed ?Overall Cognitive Status: History of cognitive impairments - at baseline ?  ?  ?  ?  ?  ?  ?  ?  ?  ?  ?  ?  ?  ?  ?  ?  ?General Comments: Baseline dementia - wife states that pt is ornery and they have to coax him into everything ?  ?  ? ?  ?General Comments General comments (skin integrity, edema, etc.): BP low - 94/74 supine and 96/66 sitting EOB ? ?  ?Exercises    ? ?Assessment/Plan  ?  ?PT Assessment Patient needs continued PT services  ?PT  Problem List Decreased balance;Decreased mobility;Decreased cognition;Decreased knowledge of use of DME;Decreased knowledge of precautions ? ?   ?  ?PT Treatment Interventions DME instruction;Gait training;Stair training;Functional mobility training;Therapeutic activities;Therapeutic exercise;Balance training;Cognitive remediation;Patient/family education   ? ?PT Goals (Current goals can be found in the Care Plan section)  ?Acute Rehab PT Goals ?Patient Stated Goal: none stated; wife wants him to be able to walk and return home ?PT Goal Formulation: With family ?Time For Goal Achievement: 01/10/22 ?Potential to Achieve Goals: Fair ? ?  ?Frequency Min 3X/week ?  ? ? ?Co-evaluation PT/OT/SLP Co-Evaluation/Treatment: Yes ?Reason for Co-Treatment: Necessary to address cognition/behavior during functional activity;For patient/therapist safety ?PT goals addressed during session: Mobility/safety with mobility;Balance ?OT goals addressed during session: Other (comment) (bed mobility and transfers) ?  ? ? ?  ?AM-PAC PT "6 Clicks" Mobility  ?Outcome Measure Help needed turning from your back to your side while in a flat bed without using bedrails?: None ?Help needed moving from lying on your back to sitting on the side of a flat bed without using bedrails?: Total ?Help needed  moving to and from a bed to a chair (including a wheelchair)?: Total ?Help needed standing up from a chair using your arms (e.g., wheelchair or bedside chair)?: Total ?Help needed to walk in hospital room?: Total ?Help needed climbing 3-5 steps with a railing? : Total ?6 Click Score: 9 ? ?  ?End of Session Equipment Utilized During Treatment: Gait belt ?Activity Tolerance: Treatment limited secondary to medical complications (Comment) (AMS) ?Patient left: in bed;with call bell/phone within reach;with bed alarm set;with family/visitor present ?Nurse Communication: Mobility status;Other (comment) (if pt tries to get up, nursing try to assist him to assess  if he can (prob need to catch him when he wants to get up)) ?PT Visit Diagnosis: Other abnormalities of gait and mobility (R26.89);Other symptoms and signs involving the nervous system (R29.898) ?  ? ?Time: 6503-5465 ?PT Ti

## 2021-12-27 NOTE — Progress Notes (Signed)
Brief Palliative Medicine Progress Note: ? ?Received updates from Dr. Thedore Mins - he was able to follow up for GOC with patient's wife this morning.  ? ?At this time, goal is to continue gentle medical treatment with watchful waiting. Will give 24 hours and if no improvement family open for transition to full comfort measures tomorrow. Patient is also now DNR/DNI.   ? ?PMT will follow up tomorrow 5/1 for continued GOC pending clinical course. ? ?Thank you for allowing PMT to assist in the care of this patient. ? ?[employer]. Katrinka Blazing, FNP-BC ?Palliative Medicine Team ?Team Phone: [phone] ?NO CHARGE ? ?

## 2021-12-27 NOTE — Progress Notes (Signed)
SLP Cancellation Note ? ?Patient Details ?Name: SKANDA WORLDS ?MRN: [id number] ?DOB: August 04, 1946 ? ? ?Cancelled treatment:       Reason Eval/Treat Not Completed: Patient at procedure or test/unavailable (Pt having EEG completed at this time. SLP will follow up.) ? ?Yasha Tibbett I. Vear Clock, MS, CCC-SLP ?Acute Rehabilitation Services ?Office number [phone] ?Pager [phone] ? ?Scheryl Marten ?12/27/2021, 11:51 AM ?

## 2021-12-28 DIAGNOSIS — R4182 Altered mental status, unspecified: Secondary | ICD-10-CM | POA: Diagnosis not present

## 2021-12-28 DIAGNOSIS — Z66 Do not resuscitate: Secondary | ICD-10-CM

## 2021-12-28 DIAGNOSIS — E860 Dehydration: Secondary | ICD-10-CM | POA: Diagnosis not present

## 2021-12-28 DIAGNOSIS — G300 Alzheimer's disease with early onset: Secondary | ICD-10-CM | POA: Diagnosis not present

## 2021-12-28 DIAGNOSIS — Z515 Encounter for palliative care: Secondary | ICD-10-CM | POA: Diagnosis not present

## 2021-12-28 LAB — CBC WITH DIFFERENTIAL/PLATELET
Abs Immature Granulocytes: 0.02 10*3/uL (ref 0.00–0.07)
Basophils Absolute: 0.1 10*3/uL (ref 0.0–0.1)
Basophils Relative: 1 %
Eosinophils Absolute: 0.3 10*3/uL (ref 0.0–0.5)
Eosinophils Relative: 3 %
HCT: 42.3 % (ref 39.0–52.0)
Hemoglobin: 14 g/dL (ref 13.0–17.0)
Immature Granulocytes: 0 %
Lymphocytes Relative: 40 %
Lymphs Abs: 3.2 10*3/uL (ref 0.7–4.0)
MCH: 32.2 pg (ref 26.0–34.0)
MCHC: 33.1 g/dL (ref 30.0–36.0)
MCV: 97.2 fL (ref 80.0–100.0)
Monocytes Absolute: 0.6 10*3/uL (ref 0.1–1.0)
Monocytes Relative: 8 %
Neutro Abs: 3.9 10*3/uL (ref 1.7–7.7)
Neutrophils Relative %: 48 %
Platelets: 220 10*3/uL (ref 150–400)
RBC: 4.35 MIL/uL (ref 4.22–5.81)
RDW: 12.5 % (ref 11.5–15.5)
WBC: 8.1 10*3/uL (ref 4.0–10.5)
nRBC: 0 % (ref 0.0–0.2)

## 2021-12-28 LAB — COMPREHENSIVE METABOLIC PANEL
ALT: 13 U/L (ref 0–44)
AST: 17 U/L (ref 15–41)
Albumin: 2.8 g/dL — ABNORMAL LOW (ref 3.5–5.0)
Alkaline Phosphatase: 39 U/L (ref 38–126)
Anion gap: 8 (ref 5–15)
BUN: 9 mg/dL (ref 8–23)
CO2: 25 mmol/L (ref 22–32)
Calcium: 9.2 mg/dL (ref 8.9–10.3)
Chloride: 102 mmol/L (ref 98–111)
Creatinine, Ser: 0.75 mg/dL (ref 0.61–1.24)
GFR, Estimated: >60 mL/min (ref >60)
Glucose, Bld: 159 mg/dL — ABNORMAL HIGH (ref 70–99)
Potassium: 3.8 mmol/L (ref 3.5–5.1)
Sodium: 135 mmol/L (ref 135–145)
Total Bilirubin: 0.4 mg/dL (ref 0.3–1.2)
Total Protein: 6.2 g/dL — ABNORMAL LOW (ref 6.5–8.1)

## 2021-12-28 LAB — BRAIN NATRIURETIC PEPTIDE: B Natriuretic Peptide: 21.3 pg/mL (ref 0.0–100.0)

## 2021-12-28 LAB — MAGNESIUM: Magnesium: 1.5 mg/dL — ABNORMAL LOW (ref 1.7–2.4)

## 2021-12-28 LAB — GLUCOSE, CAPILLARY
Glucose-Capillary: 145 mg/dL — ABNORMAL HIGH (ref 70–99)
Glucose-Capillary: 180 mg/dL — ABNORMAL HIGH (ref 70–99)

## 2021-12-28 MED ORDER — MAGNESIUM SULFATE 2 GM/50ML IV SOLN
2.0000 g | Freq: Once | INTRAVENOUS | Status: AC
  Administered 2021-12-28: 2 g via INTRAVENOUS
  Filled 2021-12-28: qty 50

## 2021-12-28 NOTE — Discharge Instructions (Signed)
Disposition. Home with hospice Condition.  Guarded CODE STATUS.  DNR Activity.  With assistance as tolerated, full fall precautions. Diet.  Soft with feeding assistance and aspiration precautions. Goal of care.  Comfort.  

## 2021-12-28 NOTE — Progress Notes (Signed)
Discharge paperwork reviewed with pt's wife and son. Both verbalized understanding. Pt alert and oriented only to self in no acute distress upon discharge. PTAR here to transport pt back home. Pt's family has taken pt's belongings to take back home.  ?

## 2021-12-28 NOTE — Progress Notes (Signed)
?  Transition of Care (TOC) Screening Note ? ? ?Patient Details  ?Name: Cole Harris ?Date of Birth: 08-18-1946 ? ? ?Transition of Care (TOC) CM/SW Contact:    ?Harriet Masson, RN ?Phone Number: ?12/28/2021, 9:07 AM ? ? ? ?Transition of Care Department [facility]) has reviewed patient and no TOC needs have been identified at this time. We will continue to monitor patient advancement through interdisciplinary progression rounds. If new patient transition needs arise, please place a TOC consult. ? ? ?

## 2021-12-28 NOTE — Discharge Summary (Signed)
?                                                                                ? ?Cole ChaletCharles V Harris ZOX:[id number] DOB: 08/06/1946 DOA: 12/26/2021 ? ?PCP: Eustaquio BoydenGutierrez, Javier, MD ? ?Admit date: 12/26/2021  Discharge date: 12/28/2021 ? ?Admitted From: Home   Disposition:  Home with Hospice ? ? ?Recommendations for Outpatient Follow-up:  ? ?Follow up with PCP in 1-2 weeks ? ?PCP Please obtain BMP/CBC, 2 view CXR in 1week,  (see Discharge instructions)  ? ?PCP Please follow up on the following pending results:  ? ? ?Home Health: None   ?Equipment/Devices: None  ?Consultations: None  ?Discharge Condition: Guarded   ?CODE STATUS: DNR   ?Diet Recommendation: Soft diet with feeding assistance and aspiration precautions ? ? ?Chief Complaint  ?Patient presents with  ? Altered Mental Status  ?  Per pt family, believes that pt has an urinary tract infection. Pt is on hospice   ?  ? ?Brief history of present illness from the day of admission and additional interim summary   ? ?76 y.o. male with medical history significant of hypertension, hyperlipidemia, pulmonary hypertension, diabetes mellitus type 2, Alzheimer's disease, depression, back pain s/p spinal stimulator and GERD who presents with with a couple days of not feeling well. ?  ?Patient was diagnosed with gradually progressive dementia several years ago and since then he has been gradually declining in terms of quality of life and mental status, in the ER he was found obtunded, head CT was nonacute, family now wishes gentle medical treatment, DNR and if declines full comfort measures. ? ?                                                               Hospital Course  ? ?AMS due to gradually progressive Alzheimer's dementia worsened by metabolic encephalopathy due to dehydration.  This most likely is gradually progressive Alzheimer's dementia with mild worsening due to dehydration, patient  is now DNR, had detailed discussion with his wife bedside on 12/27/2021, they were interested in making him DNR and taking him home with home hospice.  At this time he will be discharged home with residential hospice with goal of care being comfort only. ?  ?  ?2.  Dehydration with hypomagnesemia.  Was hydrated with IV fluids, magnesium was replaced. ?  ?3.  Alzheimer's dementia.  Plan as above.  Continue memantine and donepezil.  Also on risperidone.  Depakote as #1 above. ?  ?4.  History of depression.  Continue Wellbutrin and Lexapro if he can tolerate ?  ?5.  GERD.  On PPI ?  ?6.  Dyslipidemia.  On Crestor. ?  ?7.  OSA.  Nighttime CPAP if alert enough, wife counseled that if patient is drowsy on a continuous basis then avoid CPAP as risk of aspiration goes up. ?  ?8.  DM type II.  diet control only now, comfort measures at home. ? ? ?Discharge diagnosis   ? ? ?  Principal Problem: ?  Altered mental status ?Active Problems: ?  Alzheimer's disease with early onset (HCC) ?  Uncontrolled type 2 diabetes mellitus with hyperglycemia (HCC) ?  OSA (obstructive sleep apnea) ?  GERD (gastroesophageal reflux disease) ?  Leukocytosis ?  Dehydration ?  Depression ?  Acute encephalopathy ?  Encephalopathy acute ? ? ? ?Discharge instructions   ? ?Discharge Instructions   ? ? Discharge instructions   Complete by: As directed ?  ? Disposition.  Home with hospice ?Condition.  Guarded ?CODE STATUS.  DNR ?Activity.  With assistance as tolerated, full fall precautions. ?Diet.  Soft with feeding assistance and aspiration precautions. ?Goal of care.  Comfort.  ? ?  ? ? ?Discharge Medications  ? ?Allergies as of 12/28/2021   ?No Known Allergies ?  ? ?  ?Medication List  ?  ? ?STOP taking these medications   ? ?metFORMIN 500 MG 24 hr tablet ?Commonly known as: GLUCOPHAGE-XR ?  ? ?  ? ?TAKE these medications   ? ?acetaminophen 325 MG tablet ?Commonly known as: TYLENOL ?Take 2 tablets (650 mg total) by mouth every 6 (six) hours as needed for  mild pain or moderate pain (or Fever). ?  ?buPROPion 150 MG 24 hr tablet ?Commonly known as: WELLBUTRIN XL ?TAKE ONE TABLET BY MOUTH DAILY ?  ?divalproex 125 MG DR tablet ?Commonly known as: DEPAKOTE ?Take 125 mg by mouth See admin instructions. 125 mg in the morning  ?250 mg at  bedtime ?  ?escitalopram 20 MG tablet ?Commonly known as: LEXAPRO ?TAKE ONE TABLET BY MOUTH DAILY ?  ?Memantine HCl-Donepezil HCl 28-10 MG Cp24 ?Take 1 capsule by mouth daily. ?  ?naproxen sodium 220 MG tablet ?Commonly known as: ALEVE ?Take 1 tablet (220 mg total) by mouth daily as needed. ?What changed: reasons to take this ?  ?omeprazole 20 MG capsule ?Commonly known as: PRILOSEC ?Take 1 capsule (20 mg total) by mouth daily. For reflux/heartburn ?What changed: additional instructions ?  ?risperiDONE 0.25 MG tablet ?Commonly known as: RISPERDAL ?Take 0.25 mg by mouth at bedtime. ?  ?rosuvastatin 20 MG tablet ?Commonly known as: CRESTOR ?TAKE ONE TABLET BY MOUTH AT BEDTIME ?What changed: when to take this ?  ?vitamin B-12 1000 MCG tablet ?Commonly known as: CYANOCOBALAMIN ?Take 1,000 mcg by mouth daily. ?  ? ?  ? ? ? Follow-up Information   ? ? Eustaquio Boyden, MD Follow up.   ?Specialty: Family Medicine ?Why: As needed ?Contact information: ?[address] [place] ?[place] [address] ?[phone] ? ? ?  ?  ? ?  ?  ? ?  ? ? ?Major procedures and Radiology Reports - PLEASE review detailed and final reports thoroughly  -    ? ?  ?CT HEAD WO CONTRAST ( ) ? ?Result Date: 12/27/2021 ?CLINICAL DATA:  Delirium EXAM: CT HEAD WITHOUT CONTRAST TECHNIQUE: Contiguous axial images were obtained from the base of the skull through the vertex without intravenous contrast. RADIATION DOSE REDUCTION: This exam was performed according to the departmental dose-optimization program which includes automated exposure control, adjustment of the mA and/or kV according to patient size and/or use of iterative reconstruction technique. COMPARISON:  12/26/2021  FINDINGS: Brain: No evidence of acute infarction, hemorrhage, hydrocephalus, extra-axial collection or mass lesion/mass effect. Mild low-density changes within the periventricular and subcortical white matter compatible with chronic microvascular ischemic change. Mild-moderate diffuse cerebral volume loss. Vascular: Atherosclerotic calcifications involving the large vessels of the skull base. No unexpected hyperdense vessel. Skull: Normal. Negative for fracture  or focal lesion. Sinuses/Orbits: No acute finding. Other: None. IMPRESSION: Stable exam.  No acute intracranial findings. Electronically Signed   By: Duanne Guess D.O.   On: 12/27/2021 13:01  ? ?CT Head Wo Contrast ? ?Result Date: 12/26/2021 ?CLINICAL DATA:  Altered mental status. EXAM: CT HEAD WITHOUT CONTRAST TECHNIQUE: Contiguous axial images were obtained from the base of the skull through the vertex without intravenous contrast. RADIATION DOSE REDUCTION: This exam was performed according to the departmental dose-optimization program which includes automated exposure control, adjustment of the mA and/or kV according to patient size and/or use of iterative reconstruction technique. COMPARISON:  08/18/2017. FINDINGS: Brain: No acute intracranial hemorrhage, midline shift or mass effect. No extra-axial fluid collection. Diffuse atrophy is noted. Periventricular white matter hypodensities are noted bilaterally. No hydrocephalus. Vascular: No hyperdense vessel or unexpected calcification. Skull: Normal. Negative for fracture or focal lesion. Sinuses/Orbits: Mild mucosal thickening is present in the maxillary sinuses and ethmoid air cells. No acute orbital abnormality. Other: None. IMPRESSION: 1. No acute intracranial process. 2. Atrophy with chronic microvascular ischemic changes. Electronically Signed   By: Thornell Sartorius M.D.   On: 12/26/2021 04:59  ? ?DG Chest Portable 1 View ? ?Result Date: 12/26/2021 ?CLINICAL DATA:  Altered mental status EXAM: PORTABLE  CHEST 1 VIEW COMPARISON:  06/30/2021 FINDINGS: Low lung volumes. Mild right basilar atelectasis. No pleural effusion or pneumothorax. The heart is normal in size. Cervical spine fixation hardware, IMPRE

## 2021-12-28 NOTE — Telephone Encounter (Signed)
ERx 

## 2021-12-28 NOTE — Progress Notes (Signed)
Civil engineer, contracting [facility]) Hospital Liaison Note ? ?Referral received for patient/family interest in home with hospice.  ? ?[facility] liaison spoke with patient's son Cole Harris who stated that he is doing research on different agencies and will call us back once he decides.  ? ?Information on [facility] provided.  ? ?Please call with any questions or concerns. Thank you ? ?Dionicio Stall, LCSW ?[facility] Hospital Liaison ?[phone] ?

## 2021-12-28 NOTE — Progress Notes (Signed)
? ?  Referral received from [facility] Kristi to speak with family regarding hospice care. We were able to contact the brother Cole Harris. He lives with pt and pt's wife. He is interested in Hospice care and would like to proceed with hospice care at home. I have ordered oxygen for comfort to be used in future if needed hospital bed, Over bed table and wheelchair to be delivered by [employer].  ?

## 2021-12-28 NOTE — Progress Notes (Addendum)
?                                                                                                                                                     ?                                                   ?Daily Progress Note  ? ?Patient Name: Cole Harris       Date: 12/28/2021 ?DOB: [birth year]  Age: 76 y.o. MRN#: [id number] ?Attending Physician: Leroy Sea, MD ?Primary Care Physician: Eustaquio Boyden, MD ?Admit Date: 12/26/2021 ? ?Reason for Consultation/Follow-up: Disposition and Establishing goals of care ? ?Subjective: ?Chart review performed.  Received report from primary RN -no acute concerns.  ? ?Went to visit patient at bedside -son/[name] and daughter-in-law/Patricia were present.  Patient was sitting up in bed awake, alert, pleasantly confused, unable to participate in meaningful conversation.  No signs or non-verbal gestures of pain or discomfort noted. No respiratory distress, increased work of breathing, or secretions noted.  [name] was feeding patient bites of lunch which he excepted without issue. ? ?Emotional support provided to family.  Reviewed interval history and provided medical updates since speaking with [name] on Saturday.  Therapeutic listening provided as family reflect over patient's decline.  They state that patient has had had a drastic decline over the last year.  ? ?Family understands that dementia is a progressive, non-curable disease underlying the patient's current acute medical conditions. Per, Patricia's request we reviewed specific indicators of end stage dementia, including inability to communicate, bed bound/non-ambulatory status, decreased oral intake, swallowing dysfunction, and incontinence of bowel/bladder. Patient's history is significant for gradually worsening progressive symptoms of dementia (to include incontinence, increasing functional decline where patient is now almost bedbound, unable to form meaningful sentences, new swallowing dysfunction) especially  considering ED work up has not found medical indications for decline. Natural disease trajectory and expectations at EOL were discussed. I attempted to elicit values and goals of care important to the patient. The difference between aggressive medical intervention and comfort care was considered in light of the patient's goals of care. Family reiterates that their goal is to not have him rehospitalized but would prefer him stay home and be kept comfortable. ? ?Discussion on the difference between Palliative and Hospice care provided per [name]'s request. Palliative care and hospice have similar goals of managing symptoms, promoting comfort, improving quality of life, and maintaining a person's dignity. However, palliative care may be offered during any phase of a serious illness, while hospice care is usually offered when a person is expected to live for 6 months or less. ? ?Provided education and counseling at length on the  philosophy and benefits of hospice care per their request. Discussed that it offers a holistic approach to care in the setting of end-stage illness, and is about supporting the patient where they are allowing nature to take it's course. Discussed the hospice team includes RNs, physicians, social workers, and chaplains. They can provide personal care, support for the family, and help keep patient out of the hospital as well as assist with DME needs for home hospice. Education provided on the difference between home vs residential hospice.  ? ?Reviewed hospice in context of insurance coverage to the best of my ability as well as prognostication per their request. ? ?After discussion family feel that home hospice aligns with their goals.  Reviewed that patient is already enrolled with [facility]  -family would like to explore other hospice options and will notify when facility has been chosen. ? ?All questions and concerns addressed. Encouraged to call with questions and/or concerns. PMT card  provided. ? ?Later received follow-up phone call from [name] stating they had chosen to proceed with [facility]. ? ?Length of Stay: 1 ? ?Current Medications: ?Scheduled Meds:  ? buPROPion  75 mg Oral BID  ? donepezil  10 mg Oral Daily  ? enoxaparin (LOVENOX) injection  0.5 mg/kg Subcutaneous Q24H  ? escitalopram  20 mg Oral Daily  ? insulin aspart  0-15 Units Subcutaneous TID WC  ? memantine  10 mg Oral BID  ? pantoprazole  40 mg Oral Daily  ? risperiDONE  0.25 mg Oral QHS  ? rosuvastatin  20 mg Oral Daily  ? sodium chloride flush  3 mL Intravenous Q12H  ? ? ?Continuous Infusions: ? sodium chloride 75 mL/hr at 12/28/21 0943  ? magnesium sulfate bolus IVPB 2 g (12/28/21 0944)  ? valproate sodium 125 mg (12/28/21 0535)  ? ? ?PRN Meds: ?acetaminophen **OR** acetaminophen, albuterol, ondansetron **OR** ondansetron (ZOFRAN) IV ? ?Physical Exam ?Vitals and nursing note reviewed.  ?Constitutional:   ?   General: He is not in acute distress. ?Pulmonary:  ?   Effort: No respiratory distress.  ?Skin: ?   General: Skin is warm and dry.  ?Neurological:  ?   Mental Status: He is alert. Mental status is at baseline. He is disoriented and confused.  ?   Motor: Weakness present.  ?Psychiatric:     ?   Cognition and Memory: Cognition is impaired. Memory is impaired.  ?         ? ?Vital Signs: BP (!) 117/53 (BP Location: Right Arm)   Pulse 60   Temp 98.4 ?F (36.9 ?C) (Axillary)   Resp 18   Ht 5\' 7"  (1.702 m)   Wt 113 kg   SpO2 96%   BMI 39.02 kg/m?  ?SpO2: SpO2: 96 % ?O2 Device: O2 Device: Bi-PAP ?O2 Flow Rate:   ? ?Intake/output summary:  ?Intake/Output Summary (Last 24 hours) at 12/28/2021 1030 ?Last data filed at 12/27/2021 1819 ?Gross per 24 hour  ?Intake 521.09 ml  ?Output 1400 ml  ?Net -878.91 ml  ? ?LBM: Last BM Date :  (PTA) ?Baseline Weight: Weight: 113 kg ?Most recent weight: Weight: 113 kg ? ?     ?Palliative Assessment/Data: PPS 30% ? ? ? ? ? ?Patient Active Problem List  ? Diagnosis Date Noted  ? Acute  encephalopathy 12/27/2021  ? Encephalopathy acute 12/27/2021  ? Altered mental status 12/26/2021  ? Leukocytosis 12/26/2021  ? Dehydration 12/26/2021  ? Depression 12/26/2021  ? Medicare annual wellness visit, subsequent 12/05/2021  ?  Advanced directives, counseling/discussion 12/05/2021  ? Urinary incontinence 12/05/2021  ? Bowel incontinence 12/05/2021  ? Acute lower UTI 07/01/2021  ? Glycosuria 06/22/2021  ? GERD (gastroesophageal reflux disease) 12/05/2020  ? Nontraumatic rupture of right proximal biceps tendon 03/29/2019  ? OSA (obstructive sleep apnea) 02/24/2019  ? Uncontrolled type 2 diabetes mellitus with hyperglycemia (HCC) 05/10/2018  ? Alzheimer's disease with early onset (HCC) 08/24/2017  ? Vertebrobasilar insufficiency 08/24/2017  ? CAD (coronary artery disease) 08/24/2017  ? Dyslipidemia associated with type 2 diabetes mellitus (HCC) 08/24/2017  ? Pulmonary hypertension (HCC) 08/24/2017  ? Hypothyroidism 08/24/2017  ? Vitamin B12 deficiency 08/16/2017  ? ? ?Palliative Care Assessment & Plan  ? ?Patient Profile: ?76 y.o. male  with past medical history of hypertension, hyperlipidemia, pulmonary hypertension, diabetes mellitus type 2, Alzheimer's disease, depression, back pain s/p spinal stimulator and GERD presented to the ED on 12/26/2021 from home with family concerns of AMS -thought he may have a UTI.  Urinalysis, head CT, labs, chest x-ray were all relatively unremarkable.  MRI is pending.  Suspect symptoms are secondary to worsening dementia.  Patient was admitted on 12/26/2021 with AMS, leukocytosis, dehydration.  ? ?Assessment: ?Principal Problem: ?  Altered mental status ?Active Problems: ?  Alzheimer's disease with early onset (HCC) ?  Uncontrolled type 2 diabetes mellitus with hyperglycemia (HCC) ?  OSA (obstructive sleep apnea) ?  GERD (gastroesophageal reflux disease) ?  Leukocytosis ?  Dehydration ?  Depression ?  Acute encephalopathy ?  Encephalopathy  acute ? ? ?Recommendations/Plan: ?Patient discharging today ?Continue DNR/DNI - durable DNR form completed and placed in shadow chart. Copy was made and will be scanned into Vynca/ACP tab ?Discharge home with hospice - requesting [facility]; T

## 2022-01-04 ENCOUNTER — Ambulatory Visit: Payer: Medicare Other | Admitting: *Deleted

## 2022-01-04 ENCOUNTER — Telehealth: Payer: Medicare Other | Admitting: Hospice

## 2022-01-05 NOTE — Chronic Care Management (AMB) (Signed)
CSW was able to make initial contact with patient's son  who reports pt is now being followed by Hospice. CSW advised of plans to sign off given their involvement and care/support being provided.  ?Reece Levy MSW, LCSW ?Licensed Clinical Social Worker ?[facility]   ?[phone]  ?

## 2022-02-27 DEATH — deceased

## 2022-03-25 ENCOUNTER — Telehealth: Payer: Self-pay | Admitting: Family Medicine

## 2022-03-25 NOTE — Telephone Encounter (Signed)
Patients Daughter-in law called about patients wife and had mentioned that patient passed away on 02/17/22. She said he was in hospice care

## 2022-03-26 NOTE — Telephone Encounter (Signed)
Spoke with DIL Clydie Braun.  Offered my condolences.

## 2023-01-10 ENCOUNTER — Telehealth: Payer: Medicare Other | Admitting: Hospice
